# Patient Record
Sex: Male | Born: 1955 | Race: Black or African American | Hispanic: No | Marital: Married | Smoking: Never smoker
Health system: Southern US, Community
[De-identification: ages and names within clinical notes are randomized; demographics above are authoritative.]

## PROBLEM LIST (undated history)

## (undated) DIAGNOSIS — C801 Malignant (primary) neoplasm, unspecified: Secondary | ICD-10-CM

## (undated) DIAGNOSIS — I10 Essential (primary) hypertension: Secondary | ICD-10-CM

## (undated) HISTORY — PX: BACK SURGERY: SHX140

## (undated) HISTORY — PX: SHOULDER SURGERY: SHX246

## (undated) HISTORY — PX: KNEE SURGERY: SHX244

---

## 2010-08-20 ENCOUNTER — Other Ambulatory Visit (HOSPITAL_COMMUNITY): Payer: Self-pay | Admitting: Neurological Surgery

## 2010-08-20 ENCOUNTER — Ambulatory Visit (HOSPITAL_COMMUNITY)
Admission: RE | Admit: 2010-08-20 | Discharge: 2010-08-20 | Disposition: A | Discharge status: Home / Self Care | Payer: Worker's Compensation | Source: Ambulatory Visit | Attending: Neurological Surgery | Admitting: Neurological Surgery

## 2010-08-20 ENCOUNTER — Encounter (HOSPITAL_COMMUNITY)
Admission: RE | Admit: 2010-08-20 | Discharge: 2010-08-20 | Disposition: A | Discharge status: Home / Self Care | Payer: Worker's Compensation | Source: Ambulatory Visit | Attending: Neurological Surgery | Admitting: Neurological Surgery

## 2010-08-20 DIAGNOSIS — M47814 Spondylosis without myelopathy or radiculopathy, thoracic region: Secondary | ICD-10-CM | POA: Insufficient documentation

## 2010-08-20 DIAGNOSIS — Z01812 Encounter for preprocedural laboratory examination: Secondary | ICD-10-CM | POA: Insufficient documentation

## 2010-08-20 DIAGNOSIS — Z01810 Encounter for preprocedural cardiovascular examination: Secondary | ICD-10-CM | POA: Insufficient documentation

## 2010-08-20 DIAGNOSIS — Z01818 Encounter for other preprocedural examination: Secondary | ICD-10-CM | POA: Insufficient documentation

## 2010-08-20 DIAGNOSIS — M4802 Spinal stenosis, cervical region: Secondary | ICD-10-CM | POA: Insufficient documentation

## 2010-08-20 LAB — DIFFERENTIAL
Basophils Relative: 1 % (ref 0–1)
Lymphocytes Relative: 35 % (ref 12–46)
Lymphs Abs: 1.5 10*3/uL (ref 0.7–4.0)
Monocytes Absolute: 0.5 10*3/uL (ref 0.1–1.0)
Monocytes Relative: 11 % (ref 3–12)
Neutro Abs: 2.1 10*3/uL (ref 1.7–7.7)
Neutrophils Relative %: 47 % (ref 43–77)

## 2010-08-20 LAB — CBC
HCT: 42.9 % (ref 39.0–52.0)
Hemoglobin: 14.2 g/dL (ref 13.0–17.0)
MCH: 26.9 pg (ref 26.0–34.0)
MCHC: 33.1 g/dL (ref 30.0–36.0)
RBC: 5.27 MIL/uL (ref 4.22–5.81)

## 2010-08-20 LAB — BASIC METABOLIC PANEL
BUN: 12 mg/dL (ref 6–23)
CO2: 29 mEq/L (ref 19–32)
Calcium: 10 mg/dL (ref 8.4–10.5)
GFR calc non Af Amer: >60 mL/min (ref >60)
Glucose, Bld: 97 mg/dL (ref 70–99)
Sodium: 139 mEq/L (ref 135–145)

## 2010-08-20 LAB — SURGICAL PCR SCREEN: Staphylococcus aureus: NEGATIVE

## 2010-08-26 ENCOUNTER — Ambulatory Visit (HOSPITAL_COMMUNITY): Payer: Worker's Compensation

## 2010-08-26 ENCOUNTER — Observation Stay (HOSPITAL_COMMUNITY)
Admission: RE | Admit: 2010-08-26 | Discharge: 2010-08-27 | Disposition: A | Discharge status: Home / Self Care | Payer: Worker's Compensation | Source: Ambulatory Visit | Attending: Neurological Surgery | Admitting: Neurological Surgery

## 2010-08-26 DIAGNOSIS — M4712 Other spondylosis with myelopathy, cervical region: Principal | ICD-10-CM | POA: Insufficient documentation

## 2010-08-26 DIAGNOSIS — Z01818 Encounter for other preprocedural examination: Secondary | ICD-10-CM | POA: Insufficient documentation

## 2010-08-26 DIAGNOSIS — C61 Malignant neoplasm of prostate: Secondary | ICD-10-CM | POA: Insufficient documentation

## 2010-08-26 DIAGNOSIS — M5020 Other cervical disc displacement, unspecified cervical region: Secondary | ICD-10-CM | POA: Insufficient documentation

## 2010-08-26 DIAGNOSIS — J45909 Unspecified asthma, uncomplicated: Secondary | ICD-10-CM | POA: Insufficient documentation

## 2010-08-26 DIAGNOSIS — Z01812 Encounter for preprocedural laboratory examination: Secondary | ICD-10-CM | POA: Insufficient documentation

## 2010-08-26 DIAGNOSIS — Z01810 Encounter for preprocedural cardiovascular examination: Secondary | ICD-10-CM | POA: Insufficient documentation

## 2010-09-02 NOTE — Op Note (Signed)
NAME:  Andrew Miles, Andrew Miles NO.:  [id number]  MEDICAL RECORD NO.:  [id number]  LOCATION:  3526                         FACILITY:  [facility]  PHYSICIAN:  Stefani Dama, M.D.  DATE OF BIRTH:  08-19-1955  DATE OF PROCEDURE:  08/26/2010 DATE OF DISCHARGE:  08/27/2010                              OPERATIVE REPORT   PREOPERATIVE DIAGNOSES:  Spondylosis plus herniated nucleus pulposus, C4- C5 and C5-C6 with cervical radiculopathy, cervical myelopathy.  POSTOPERATIVE DIAGNOSES:  Spondylosis plus herniated nucleus pulposus, C4-C5 and C5-C6 with cervical radiculopathy, cervical myelopathy.  PROCEDURE:  Anterior cervical decompression, C4-C5 and C5-C6; arthrodesis with structural allograft, Alphatec plate fixation, C4-C6.  SURGEON:  Stefani Dama, MD  FIRST ASSISTANT:  Clydene Fake, MD  ANESTHESIA:  General endotracheal.  INDICATIONS:  Andrew Miles is a 55 year old individual who has had significant neck, shoulder, and arm pain and has evidence of spondylosis in the cervical spine.  He has on top of this herniated nucleus pulposus which causes cord compression eccentric to the right side.  He has had significant neck pain and spasm with a Lhermitte's phenomenon in addition to weakness in the biceps on the right side.  The patient was advised regarding need for surgical decompression arthrodesis and this is undertaken currently to decompress both the spinal cord and the C6 nerve root on the right side and C5 nerve root bilaterally secondary to spondylitic disease and bilateral foraminal stenosis.  PROCEDURE IN DETAIL:  The patient was brought to the operating room and placed on the table in supine position.  After the smooth induction of general endotracheal anesthesia, the neck was prepped with alcohol and DuraPrep and draped in a sterile fashion.  Five pounds of halter traction was also employed.  A transverse incision was created in the neck after identifying  the proper localization and dissection was then carried down through the platysma.  The plane between the sternocleidomastoid and the strap muscle was dissected bluntly until the first prevertebral space could be reached.  This was identified positively on a radiograph as that of C4-C5.  Then by stripping the longus colli muscle off either side of midline, a self-retaining retractor could be placed into the wound.  Ventral osteophytes were removed from C4-C5 and disk space was entered with a #15 blade and a combination of Kerrison and rongeurs was used to evacuate a significant quantity of severely degenerated desiccated disk material from within the space.  As the dissection continued, the interspace was opened and the region of posterior longitudinal ligament was carefully identified. There was noted be significant osteophytosis from the inferior margin at the body of C4 and the uncinate processes were severely hypertrophied. These were drilled down with a 2-mm dissecting tool and high-speed electric drill.  The old spur and the ligament were then lifted with a 1- mm and a 2-mm Kerrison punch and this opened up the epidural space. Dissection was carried out from the center region out to the lateral recesses on the right side and on the left side.  Once this was completed, the C5 nerve roots were well decompressed out into the lateral recesses.  Hemostasis in the epidural space was achieved meticulously with  bipolar cautery and small pledgets of Gelfoam soaked in thrombin which were later irrigated away.  A 7-mm large AlphaGraft was used.  This was contoured with a high-speed bur and barrel bit.  The endplates were also shaped to the same barrel bit and then the graft was filled with demineralized bone matrix and placed into the interspace. Attention was then turned to C5-C6 where similar process was carried out and here was even larger bone spurs, particularly on the right side  at C5-C6.  These were removed and allowed for good decompression of the C6 nerve root and the common dural tube.  Once the space was decompressed well and hemostasis was again reestablished, the endplates were shaved smooth.  A 7-mm transgraft of this appropriate size and shape was again fitted and filled with demineralized bone matrix placed in the interspace and at this time traction was removed.  The procedure was done by myself and Dr. Phoebe Perch with Dr. Phoebe Perch helping with retraction and working on the contralateral side as to provide an adequate decompression as we progressed.  At this point, a 34-mm standard size Alphatec plate of the Trestle variety was fitted to the ventral aspect of the vertebral bodies and secured with 6 locking variable angle 4 x 14 mm screws.  Final radiograph was obtained which showed the superior portion of the construct nicely.  The inferior portion was buried into the shoulders, this despite using some traction to obtain good radiograph.  With the visual inspection then verifying good position of the hardware, the wound was irrigated copiously with antibiotic irrigating solution.  Hemostasis in the soft tissues was meticulously obtained.  Remnants of some demineralized bone matrix were placed into the lateral gutters at C4-C5 and C5-C6 and the retractors were removed and the platysma was closed with 3-0 Vicryl in interrupted fashion, 3-0 Vicryl using subcuticular tissues.  Dermabond was used on the skin. Blood loss for the procedure was estimated at about 150 mL.  The patient tolerated the procedure well and was returned to the recovery room in stable condition.     Stefani Dama, M.D.     Merla Riches  D:  08/28/2010  T:  08/28/2010  Job:  [id number]  Electronically Signed by Barnett Abu M.D. on 09/02/2010 07:31:51 AM

## 2013-05-16 ENCOUNTER — Other Ambulatory Visit: Payer: Self-pay | Admitting: Orthopedic Surgery

## 2013-05-16 DIAGNOSIS — M545 Low back pain, unspecified: Secondary | ICD-10-CM

## 2013-05-20 ENCOUNTER — Ambulatory Visit
Admission: RE | Admit: 2013-05-20 | Discharge: 2013-05-20 | Disposition: A | Discharge status: Home / Self Care | Payer: No Typology Code available for payment source | Source: Ambulatory Visit | Attending: Orthopedic Surgery | Admitting: Orthopedic Surgery

## 2013-05-20 DIAGNOSIS — M545 Low back pain, unspecified: Secondary | ICD-10-CM

## 2015-11-08 ENCOUNTER — Emergency Department (HOSPITAL_BASED_OUTPATIENT_CLINIC_OR_DEPARTMENT_OTHER): Payer: No Typology Code available for payment source

## 2015-11-08 ENCOUNTER — Encounter (HOSPITAL_BASED_OUTPATIENT_CLINIC_OR_DEPARTMENT_OTHER): Payer: Self-pay | Admitting: Emergency Medicine

## 2015-11-08 ENCOUNTER — Emergency Department (HOSPITAL_BASED_OUTPATIENT_CLINIC_OR_DEPARTMENT_OTHER)
Admission: EM | Admit: 2015-11-08 | Discharge: 2015-11-08 | Disposition: A | Discharge status: Home / Self Care | Payer: No Typology Code available for payment source | Attending: Emergency Medicine | Admitting: Emergency Medicine

## 2015-11-08 DIAGNOSIS — I10 Essential (primary) hypertension: Secondary | ICD-10-CM | POA: Insufficient documentation

## 2015-11-08 DIAGNOSIS — Y999 Unspecified external cause status: Secondary | ICD-10-CM | POA: Insufficient documentation

## 2015-11-08 DIAGNOSIS — Z79899 Other long term (current) drug therapy: Secondary | ICD-10-CM | POA: Insufficient documentation

## 2015-11-08 DIAGNOSIS — Y92410 Unspecified street and highway as the place of occurrence of the external cause: Secondary | ICD-10-CM | POA: Insufficient documentation

## 2015-11-08 DIAGNOSIS — S4992XA Unspecified injury of left shoulder and upper arm, initial encounter: Secondary | ICD-10-CM | POA: Insufficient documentation

## 2015-11-08 DIAGNOSIS — Y9389 Activity, other specified: Secondary | ICD-10-CM | POA: Diagnosis not present

## 2015-11-08 HISTORY — DX: Essential (primary) hypertension: I10

## 2015-11-08 MED ORDER — HYDROCODONE-ACETAMINOPHEN 5-325 MG PO TABS: 1.0000 | ORAL_TABLET | Freq: Four times a day (QID) | ORAL | 0 refills | Status: DC | PRN

## 2015-11-08 MED ORDER — HYDROCODONE-ACETAMINOPHEN 5-325 MG PO TABS
1.0000 | ORAL_TABLET | Freq: Once | ORAL | Status: AC
  Administered 2015-11-08: 1 via ORAL
  Filled 2015-11-08: qty 1

## 2015-11-08 MED ORDER — NAPROXEN 250 MG PO TABS
500.0000 mg | ORAL_TABLET | Freq: Once | ORAL | Status: AC
  Administered 2015-11-08: 500 mg via ORAL
  Filled 2015-11-08: qty 2
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
## 2020-06-26 ENCOUNTER — Other Ambulatory Visit: Payer: Self-pay

## 2020-06-26 ENCOUNTER — Emergency Department (HOSPITAL_BASED_OUTPATIENT_CLINIC_OR_DEPARTMENT_OTHER): Payer: Medicare Other

## 2020-06-26 ENCOUNTER — Encounter (HOSPITAL_BASED_OUTPATIENT_CLINIC_OR_DEPARTMENT_OTHER): Payer: Self-pay

## 2020-06-26 ENCOUNTER — Emergency Department (HOSPITAL_BASED_OUTPATIENT_CLINIC_OR_DEPARTMENT_OTHER)
Admission: EM | Admit: 2020-06-26 | Discharge: 2020-06-26 | Disposition: A | Discharge status: Home / Self Care | Payer: Medicare Other | Attending: Emergency Medicine | Admitting: Emergency Medicine

## 2020-06-26 DIAGNOSIS — K649 Unspecified hemorrhoids: Secondary | ICD-10-CM | POA: Diagnosis not present

## 2020-06-26 DIAGNOSIS — Z79899 Other long term (current) drug therapy: Secondary | ICD-10-CM | POA: Insufficient documentation

## 2020-06-26 DIAGNOSIS — I10 Essential (primary) hypertension: Secondary | ICD-10-CM | POA: Insufficient documentation

## 2020-06-26 DIAGNOSIS — Z8546 Personal history of malignant neoplasm of prostate: Secondary | ICD-10-CM | POA: Insufficient documentation

## 2020-06-26 DIAGNOSIS — K6289 Other specified diseases of anus and rectum: Secondary | ICD-10-CM

## 2020-06-26 HISTORY — DX: Malignant (primary) neoplasm, unspecified: C80.1

## 2020-06-26 LAB — OCCULT BLOOD X 1 CARD TO LAB, STOOL: Fecal Occult Bld: POSITIVE — AB

## 2020-06-26 LAB — COMPREHENSIVE METABOLIC PANEL
ALT: 14 U/L (ref 0–44)
AST: 21 U/L (ref 15–41)
Albumin: 3.5 g/dL (ref 3.5–5.0)
Alkaline Phosphatase: 59 U/L (ref 38–126)
Anion gap: 8 (ref 5–15)
BUN: 12 mg/dL (ref 8–23)
CO2: 26 mmol/L (ref 22–32)
Calcium: 9 mg/dL (ref 8.9–10.3)
Chloride: 103 mmol/L (ref 98–111)
Creatinine, Ser: 1.23 mg/dL (ref 0.61–1.24)
GFR, Estimated: >60 mL/min (ref >60)
Glucose, Bld: 125 mg/dL — ABNORMAL HIGH (ref 70–99)
Potassium: 3.5 mmol/L (ref 3.5–5.1)
Sodium: 137 mmol/L (ref 135–145)
Total Bilirubin: 0.2 mg/dL — ABNORMAL LOW (ref 0.3–1.2)
Total Protein: 7.6 g/dL (ref 6.5–8.1)

## 2020-06-26 LAB — CBC WITH DIFFERENTIAL/PLATELET
Abs Immature Granulocytes: 0.02 10*3/uL (ref 0.00–0.07)
Basophils Absolute: 0 10*3/uL (ref 0.0–0.1)
Basophils Relative: 1 %
Eosinophils Absolute: 0.1 10*3/uL (ref 0.0–0.5)
Eosinophils Relative: 2 %
HCT: 44.1 % (ref 39.0–52.0)
Hemoglobin: 13.8 g/dL (ref 13.0–17.0)
Immature Granulocytes: 0 %
Lymphocytes Relative: 12 %
Lymphs Abs: 1 10*3/uL (ref 0.7–4.0)
MCH: 27.2 pg (ref 26.0–34.0)
MCHC: 31.3 g/dL (ref 30.0–36.0)
MCV: 86.8 fL (ref 80.0–100.0)
Monocytes Absolute: 0.5 10*3/uL (ref 0.1–1.0)
Monocytes Relative: 6 %
Neutro Abs: 6.6 10*3/uL (ref 1.7–7.7)
Neutrophils Relative %: 79 %
Platelets: 254 10*3/uL (ref 150–400)
RBC: 5.08 MIL/uL (ref 4.22–5.81)
RDW: 14.4 % (ref 11.5–15.5)
WBC: 8.3 10*3/uL (ref 4.0–10.5)
nRBC: 0 % (ref 0.0–0.2)

## 2020-06-26 MED ORDER — TAMSULOSIN HCL 0.4 MG PO CAPS: 0.4000 mg | ORAL_CAPSULE | Freq: Every day | ORAL | 0 refills | Status: DC

## 2020-06-26 MED ORDER — FLEET ENEMA 7-19 GM/118ML RE ENEM: 1.0000 | ENEMA | Freq: Once | RECTAL | Status: DC

## 2020-06-26 MED ORDER — POLYETHYLENE GLYCOL 3350 17 G PO PACK: 17.0000 g | PACK | Freq: Every day | ORAL | 0 refills | Status: DC

## 2020-06-26 MED ORDER — HYDROCORTISONE (PERIANAL) 2.5 % EX CREA: 1.0000 "application " | TOPICAL_CREAM | Freq: Two times a day (BID) | CUTANEOUS | 0 refills | Status: DC

## 2020-06-26 NOTE — ED Notes (Signed)
Into Pt. Room .Marland KitchenPlaced Pt. Into gown and introduced self to Pt.  Pt. Reports he has been having blood after having BM today.  Pt. Is shaking and feels weak he reports to [employer].

## 2020-06-26 NOTE — ED Notes (Signed)
Pt. Had large BM earlier in bed.  Pt. Feeling better.

## 2020-06-26 NOTE — ED Triage Notes (Signed)
Pt arrives with c/o rectal pain starting around 10 am today states he tried to have a BM but was unable to do so, had dark blood when trying to have BM. Constipation since Friday or Saturday. Denies abdominal pain, NV pt diaphoretic in triage.

## 2020-06-26 NOTE — ED Provider Notes (Signed)
[place] EMERGENCY DEPARTMENT Provider Note   CSN: [id number] Arrival date & time: 06/26/20  1208     History Chief Complaint  Patient presents with  . Rectal Pain    Andrew Miles is a 65 y.o. male presents with a chief complaint of rectal pain and rectal bleeding.  Patient reports that he started having rectal bleeding this morning at approximately 11:30 when he was attempting to have a bowel movement.  Patient reported that bleeding was only noticed after wiping with toliwt paper.  Patient reports pain and discomfort at his rectum since this event.  Pain has been constant.  Pain is worse when attempting to have a bowel movement.  Patient's last bowel movement was on 5/27.  No blood in stool or melena on noted at that time.  Is not on any blood thinners.  Reports history of prior hernia repair.  Patient denies any abdominal pain, nausea, vomiting, diarrhea, dysuria, hematuria, fever chills, new back pain, unexpected weight loss, night sweats, penile discharge, swelling or tenderness to genitals, genital sores or lesions.   HPI     Past Medical History:  Diagnosis Date  . Cancer [facility])    prostate cancer 07/08  . Hypertension     There are no problems to display for this patient.   Past Surgical History:  Procedure Laterality Date  . BACK SURGERY    . KNEE SURGERY    . SHOULDER SURGERY         No family history on file.  Social History   Tobacco Use  . Smoking status: Never Smoker  . Smokeless tobacco: Never Used  Substance Use Topics  . Alcohol use: Yes    Comment: social  . Drug use: Never    Home Medications Prior to Admission medications   Medication Sig Start Date End Date Taking? Authorizing Provider  tamsulosin (FLOMAX) 0.4 MG CAPS capsule Take 1 capsule by mouth daily. 08/02/15  Yes [provider]  HYDROcodone-acetaminophen (NORCO) 5-325 MG tablet Take 1-2 tablets by mouth every 6 (six) hours as needed (for pain). 11/08/15    Molpus, John, MD  lisinopril (PRINIVIL,ZESTRIL) 10 MG tablet Take 10 mg by mouth daily.    [provider]  tadalafil (CIALIS) 20 MG tablet Take 20 mg by mouth daily as needed. 05/24/20   [provider]  tamsulosin (FLOMAX) 0.4 MG CAPS capsule Take 0.4 mg by mouth.    [provider]    Allergies    Patient has no known allergies.  Review of Systems   Review of Systems  Constitutional: Negative for fatigue.  Respiratory: Negative for shortness of breath.   Cardiovascular: Negative for chest pain.  Gastrointestinal: Positive for anal bleeding, constipation, diarrhea and rectal pain. Negative for abdominal distention, abdominal pain, nausea and vomiting.  Genitourinary: Negative for dysuria, genital sores, hematuria, penile discharge, penile pain, penile swelling, scrotal swelling and testicular pain.  Musculoskeletal: Positive for back pain (lumbar back pain at baseline, no change).  Skin: Negative for color change and pallor.  Neurological: Negative for syncope and light-headedness.  Hematological: Does not bruise/bleed easily.  Psychiatric/Behavioral: Negative for confusion.    Physical Exam Updated Vital Signs BP (!) 120/49 (BP Location: Left Arm)   Pulse 84   Temp 97.7 F (36.5 C) (Oral)   Resp 18   Ht 6' (1.829 m)   Wt 129.3 kg   SpO2 94%   BMI 38.65 kg/m   Physical Exam Vitals and nursing note reviewed. Exam  conducted with a chaperone present (Male nurse tech present as chaperone).  Constitutional:      General: He is not in acute distress.    Appearance: He is not ill-appearing, toxic-appearing or diaphoretic.  HENT:     Head: Normocephalic.  Eyes:     General: No scleral icterus.       Right eye: No discharge.        Left eye: No discharge.  Cardiovascular:     Rate and Rhythm: Normal rate.  Pulmonary:     Effort: Pulmonary effort is normal.  Abdominal:     General: Abdomen is protuberant. Bowel sounds are normal. There is no  distension. There are no signs of injury.     Palpations: There is no mass or pulsatile mass.     Tenderness: There is no abdominal tenderness. There is no guarding or rebound.     Hernia: There is no hernia in the umbilical area or ventral area.  Genitourinary:    Rectum: Guaiac result positive. Tenderness and external hemorrhoid present. No mass, anal fissure or internal hemorrhoid. Normal anal tone.     Comments: External hemorrhoid noted at 6 o'clock position.  Light brown stool in rectal vault, no blood or melena observed.  Hard stool was noted in patient's rectal vault.  No erythema, induration, or exquisite tenderness around patient's rectum Skin:    General: Skin is warm and dry.  Neurological:     General: No focal deficit present.     Mental Status: He is alert.  Psychiatric:        Behavior: Behavior is cooperative.     ED Results / Procedures / Treatments   Labs (all labs ordered are listed, but only abnormal results are displayed) Labs Reviewed  COMPREHENSIVE METABOLIC PANEL - Abnormal; Notable for the following components:      Result Value   Glucose, Bld 125 (*)    Total Bilirubin 0.2 (*)    All other components within normal limits  OCCULT BLOOD X 1 CARD TO LAB, STOOL - Abnormal; Notable for the following components:   Fecal Occult Bld POSITIVE (*)    All other components within normal limits  CBC WITH DIFFERENTIAL/PLATELET  POC OCCULT BLOOD, ED    EKG None  Radiology CT Abdomen Pelvis Wo Contrast  Result Date: 06/26/2020 CLINICAL DATA:  65 year old male with perirectal abscess. EXAM: CT ABDOMEN AND PELVIS WITHOUT CONTRAST TECHNIQUE: Multidetector CT imaging of the abdomen and pelvis was performed following the standard protocol without IV contrast. COMPARISON:  None. FINDINGS: Evaluation of this exam is limited in the absence of intravenous contrast. Lower chest: The visualized lung bases are clear. No intra-abdominal free air or free fluid. Hepatobiliary:  Several small hepatic hypodense lesions are not characterized on this noncontrast CT. No intrahepatic biliary dilatation. There are multiple stones within the gallbladder as well as within the gallbladder neck. No pericholecystic fluid or evidence of acute cholecystitis by CT. Ultrasound may provide better evaluation if there is high clinical concern for acute cholecystitis. Pancreas: Unremarkable. No pancreatic ductal dilatation or surrounding inflammatory changes. Spleen: Normal in size without focal abnormality. Adrenals/Urinary Tract: The adrenal glands are unremarkable. The kidneys, visualized ureters, and urinary bladder appear unremarkable. There is extension of a small portion of the bladder into the left inguinal hernia. Stomach/Bowel: There is no bowel obstruction or active inflammation. The appendix is normal. There is mild perianal edema and stranding. No fluid collection or abscess. Vascular/Lymphatic: Mild aortoiliac atherosclerotic disease. The IVC is  unremarkable. No portal venous gas. There is no adenopathy. Reproductive: Prostate fiducial markers noted. Other: Small fat containing left inguinal hernia as well as a small fat containing umbilical hernia. Musculoskeletal: Degenerative changes of the spine and hips. No acute osseous pathology. IMPRESSION: 1. Mild perianal edema and stranding. No fluid collection or abscess. 2. No bowel obstruction. Normal appendix. 3. Cholelithiasis. 4. Aortic Atherosclerosis (ICD10-I70.0). Electronically Signed   By: Anner Crete M.D.   On: 06/26/2020 16:11   DG Abdomen 1 View  Result Date: 06/26/2020 CLINICAL DATA:  Rectal pain EXAM: ABDOMEN - 1 VIEW COMPARISON:  None. FINDINGS: The bowel gas pattern is normal. 3 metallic densities project in the region of the prostate. Bilateral hip DJD. No radio-opaque calculi or other significant radiographic abnormality are seen. IMPRESSION: Normal bowel gas pattern. Electronically Signed   By: Lucrezia Europe M.D.   On:  06/26/2020 15:03    Procedures Procedures   Medications Ordered in ED Medications - No data to display  ED Course  I have reviewed the triage vital signs and the nursing notes.  Pertinent labs & imaging results that were available during my care of the patient were reviewed by me and considered in my medical decision making (see chart for details).    MDM Rules/Calculators/A&P                          Alert 65 year old male no acute distress, nontoxic-appearing.  Patient presents with chief plaint of rectal pain and bleeding.  Patient symptoms started this morning.  Patient also endorses constipation with last normal bowel movement on 5/27.  Physical exam abdomen soft, nondistended, nontender.  Patient noted to have external hemorrhoid at 6 o'clock position.  Light brown stool in rectal vault without melena or frank red blood.  Hemoccult positive.  Patient has no surrounding erythema, induration or exquisite tenderness around rectum.  Dark stool noted in patient's rectal vault.  Will obtain CBC, CMP, KUB.  CBC shows no signs of leukocytosis or anemia. CMP is unremarkable. KUB shows normal bowel gas pattern, 3 metallic densities project in the region of the prostate, bilateral hip DJD, no radiopaque calculi or other significant radiographic abnormality are seen.  Patient was able to have 2 bowel movements while in emergency department.  Patient reports that first bowel movement was quite large in nature.  Patient denied any blood or melena in his bowel movements.  Patient continues to complain of rectal pain.  We will obtain noncontrast CT scan to evaluate for possible perirectal abscess.  Noncontrast CT was obtained due to national contrast dye shortage.  CT abdomen pelvis shows mild perianal edema and stranding, no fluid collection or abscess.  No bowel obstruction.  Normal appendix.  Cholelithiasis without signs of cholecystitis.  Without signs of perianal abscess or fluid  collection Will discharge patient with prescription for Anusol and MiraLAX.  Patient advised to perform sitz bath's.  Patient advised to follow-up with primary care provider.  Discussed results, findings, treatment and follow up. Patient advised of return precautions. Patient verbalized understanding and agreed with plan.   Final Clinical Impression(s) / ED Diagnoses Final diagnoses:  Rectal pain  Hemorrhoids, unspecified hemorrhoid type    Rx / DC Orders ED Discharge Orders         Ordered    tamsulosin (FLOMAX) 0.4 MG CAPS capsule  Daily,   Status:  Discontinued        06/26/20 1255    polyethylene glycol (  MIRALAX) 17 g packet  Daily        06/26/20 1639    hydrocortisone (ANUSOL-HC) 2.5 % rectal cream  2 times daily        06/26/20 1639           Dyann Ruddle 06/26/20 2213    Quintella Reichert, MD 06/29/20 1007

## 2020-06-26 NOTE — Discharge Instructions (Addendum)
You came to the emergency department today to be evaluated for your rectal bleeding and pain.  He had a hemorrhoid on physical exam.  Your CT scan showed no perirectal abscess.  Your lab results were reassuring.  Please take MiraLAX once daily for the next 7 to 14 days.  Please use Anusol gel twice daily as prescribed.  If your symptoms do not improve please follow-up with your primary care provider.  Get help right away if you have: Uncontrolled bleeding from your rectum.

## 2021-02-13 ENCOUNTER — Emergency Department (HOSPITAL_BASED_OUTPATIENT_CLINIC_OR_DEPARTMENT_OTHER)
Admission: EM | Admit: 2021-02-13 | Discharge: 2021-02-13 | Disposition: A | Discharge status: Home / Self Care | Payer: Medicare Other | Attending: Emergency Medicine | Admitting: Emergency Medicine

## 2021-02-13 ENCOUNTER — Emergency Department (HOSPITAL_BASED_OUTPATIENT_CLINIC_OR_DEPARTMENT_OTHER): Payer: Medicare Other

## 2021-02-13 ENCOUNTER — Other Ambulatory Visit: Payer: Self-pay

## 2021-02-13 ENCOUNTER — Encounter (HOSPITAL_BASED_OUTPATIENT_CLINIC_OR_DEPARTMENT_OTHER): Payer: Self-pay | Admitting: Urology

## 2021-02-13 DIAGNOSIS — W11XXXA Fall on and from ladder, initial encounter: Secondary | ICD-10-CM | POA: Insufficient documentation

## 2021-02-13 DIAGNOSIS — M545 Low back pain, unspecified: Secondary | ICD-10-CM | POA: Diagnosis not present

## 2021-02-13 DIAGNOSIS — M542 Cervicalgia: Secondary | ICD-10-CM | POA: Diagnosis not present

## 2021-02-13 DIAGNOSIS — S7001XA Contusion of right hip, initial encounter: Secondary | ICD-10-CM | POA: Insufficient documentation

## 2021-02-13 DIAGNOSIS — M25521 Pain in right elbow: Secondary | ICD-10-CM | POA: Insufficient documentation

## 2021-02-13 DIAGNOSIS — M25551 Pain in right hip: Secondary | ICD-10-CM | POA: Insufficient documentation

## 2021-02-13 DIAGNOSIS — T148XXA Other injury of unspecified body region, initial encounter: Secondary | ICD-10-CM

## 2021-02-13 DIAGNOSIS — R072 Precordial pain: Secondary | ICD-10-CM | POA: Insufficient documentation

## 2021-02-13 DIAGNOSIS — I10 Essential (primary) hypertension: Secondary | ICD-10-CM | POA: Insufficient documentation

## 2021-02-13 DIAGNOSIS — R519 Headache, unspecified: Secondary | ICD-10-CM | POA: Diagnosis present

## 2021-02-13 DIAGNOSIS — S0003XA Contusion of scalp, initial encounter: Secondary | ICD-10-CM | POA: Insufficient documentation

## 2021-02-13 DIAGNOSIS — S0000XA Unspecified superficial injury of scalp, initial encounter: Secondary | ICD-10-CM | POA: Diagnosis present

## 2021-02-13 DIAGNOSIS — Z79899 Other long term (current) drug therapy: Secondary | ICD-10-CM | POA: Insufficient documentation

## 2021-02-13 LAB — CBC WITH DIFFERENTIAL/PLATELET
Abs Immature Granulocytes: 0.04 10*3/uL (ref 0.00–0.07)
Basophils Absolute: 0 10*3/uL (ref 0.0–0.1)
Basophils Relative: 0 %
Eosinophils Absolute: 0.1 10*3/uL (ref 0.0–0.5)
Eosinophils Relative: 1 %
HCT: 41.9 % (ref 39.0–52.0)
Hemoglobin: 13.5 g/dL (ref 13.0–17.0)
Immature Granulocytes: 0 %
Lymphocytes Relative: 8 %
Lymphs Abs: 1.2 10*3/uL (ref 0.7–4.0)
MCH: 26.8 pg (ref 26.0–34.0)
MCHC: 32.2 g/dL (ref 30.0–36.0)
MCV: 83.3 fL (ref 80.0–100.0)
Monocytes Absolute: 0.8 10*3/uL (ref 0.1–1.0)
Monocytes Relative: 6 %
Neutro Abs: 12.1 10*3/uL — ABNORMAL HIGH (ref 1.7–7.7)
Neutrophils Relative %: 85 %
Platelets: 276 10*3/uL (ref 150–400)
RBC: 5.03 MIL/uL (ref 4.22–5.81)
RDW: 14.4 % (ref 11.5–15.5)
WBC: 14.3 10*3/uL — ABNORMAL HIGH (ref 4.0–10.5)
nRBC: 0 % (ref 0.0–0.2)

## 2021-02-13 LAB — COMPREHENSIVE METABOLIC PANEL
ALT: 25 U/L (ref 0–44)
AST: 48 U/L — ABNORMAL HIGH (ref 15–41)
Albumin: 3.4 g/dL — ABNORMAL LOW (ref 3.5–5.0)
Alkaline Phosphatase: 58 U/L (ref 38–126)
Anion gap: 8 (ref 5–15)
BUN: 13 mg/dL (ref 8–23)
CO2: 21 mmol/L — ABNORMAL LOW (ref 22–32)
Calcium: 9.1 mg/dL (ref 8.9–10.3)
Chloride: 102 mmol/L (ref 98–111)
Creatinine, Ser: 1.1 mg/dL (ref 0.61–1.24)
GFR, Estimated: >60 mL/min (ref >60)
Glucose, Bld: 109 mg/dL — ABNORMAL HIGH (ref 70–99)
Potassium: 4 mmol/L (ref 3.5–5.1)
Sodium: 131 mmol/L — ABNORMAL LOW (ref 135–145)
Total Bilirubin: 0.5 mg/dL (ref 0.3–1.2)
Total Protein: 7.5 g/dL (ref 6.5–8.1)

## 2021-02-13 LAB — TROPONIN I (HIGH SENSITIVITY): Troponin I (High Sensitivity): 3 ng/L (ref <18)

## 2021-02-13 MED ORDER — MORPHINE SULFATE (PF) 4 MG/ML IV SOLN
4.0000 mg | Freq: Once | INTRAVENOUS | Status: AC
  Administered 2021-02-13: 4 mg via INTRAVENOUS
  Filled 2021-02-13: qty 1

## 2021-02-13 MED ORDER — HYDROCODONE-ACETAMINOPHEN 5-325 MG PO TABS: 1.0000 | ORAL_TABLET | Freq: Four times a day (QID) | ORAL | 0 refills | Status: DC | PRN

## 2021-02-13 MED ORDER — IOHEXOL 300 MG/ML  SOLN
125.0000 mL | Freq: Once | INTRAMUSCULAR | Status: AC | PRN
  Administered 2021-02-13: 125 mL via INTRAVENOUS

## 2021-02-13 MED ORDER — ONDANSETRON HCL 4 MG/2ML IJ SOLN
4.0000 mg | Freq: Once | INTRAMUSCULAR | Status: AC
  Administered 2021-02-13: 4 mg via INTRAVENOUS
  Filled 2021-02-13: qty 2

## 2021-02-13 NOTE — ED Provider Notes (Signed)
[facility] Provider Note   CSN: [id number] Arrival date & time: 02/13/21  1637     History  Chief Complaint  Patient presents with   fall from ladder    CECILE GUEVARA is a 66 y.o. male hx of HTN, here presenting with fall.  Patient states that he was coming down from a 10 foot ladder and was about 7 when he fell and hit his right hip.  He also fell on the right elbow and hit his head.  Patient states that he has lower back pain and headache and chest pain and right hip pain. Went to urgent care and sent for evaluation.   The history is provided by the patient.      Home Medications Prior to Admission medications   Medication Sig Start Date End Date Taking? Authorizing Provider  HYDROcodone-acetaminophen (NORCO) 5-325 MG tablet Take 1-2 tablets by mouth every 6 (six) hours as needed (for pain). 11/08/15   Molpus, John, MD  hydrocortisone (ANUSOL-HC) 2.5 % rectal cream Place 1 application rectally 2 (two) times daily. 06/26/20   Loni Beckwith, PA-C  lisinopril (PRINIVIL,ZESTRIL) 10 MG tablet Take 10 mg by mouth daily.    [provider]  polyethylene glycol (MIRALAX) 17 g packet Take 17 g by mouth daily. 06/26/20   Loni Beckwith, PA-C  tadalafil (CIALIS) 20 MG tablet Take 20 mg by mouth daily as needed. 05/24/20   [provider]      Allergies    Patient has no known allergies.    Review of Systems   Review of Systems  Musculoskeletal:  Positive for back pain.       Right hip pain and headache  All other systems reviewed and are negative.  Physical Exam Updated Vital Signs BP 125/64    Pulse 81    Temp 97.7 F (36.5 C) (Oral)    Resp 17    Ht 6' (1.829 m)    Wt 129.3 kg    SpO2 95%    BMI 38.65 kg/m  Physical Exam Vitals and nursing note reviewed.  Constitutional:      Comments: Uncomfortable  HENT:     Head:     Comments: Tenderness in posterior scalp with no obvious hematoma or laceration    Nose:  Nose normal.     Mouth/Throat:     Mouth: Mucous membranes are moist.  Eyes:     Extraocular Movements: Extraocular movements intact.     Pupils: Pupils are equal, round, and reactive to light.  Neck:     Comments: Right paracervical tenderness Cardiovascular:     Rate and Rhythm: Normal rate and regular rhythm.     Pulses: Normal pulses.     Heart sounds: Normal heart sounds.  Pulmonary:     Effort: Pulmonary effort is normal.     Breath sounds: Normal breath sounds.     Comments: Tenderness in the right lower ribs Abdominal:     General: Abdomen is flat.     Palpations: Abdomen is soft.  Musculoskeletal:     Cervical back: Normal range of motion and neck supple.     Comments: Right paralumbar and midline tenderness.  Patient has ecchymosis around the right hip area.  However he was able to range the hip.  Patient is able to bear weight on the right hip as well.  No obvious hip deformity.  Patient does have tenderness in the right elbow with no obvious deformity  Skin:    General: Skin is warm.     Capillary Refill: Capillary refill takes less than 2 seconds.  Neurological:     General: No focal deficit present.     Mental Status: He is oriented to person, place, and time.  Psychiatric:        Mood and Affect: Mood normal.    ED Results / Procedures / Treatments   Labs (all labs ordered are listed, but only abnormal results are displayed) Labs Reviewed  CBC WITH DIFFERENTIAL/PLATELET - Abnormal; Notable for the following components:      Result Value   WBC 14.3 (*)    Neutro Abs 12.1 (*)    All other components within normal limits  COMPREHENSIVE METABOLIC PANEL - Abnormal; Notable for the following components:   Sodium 131 (*)    CO2 21 (*)    Glucose, Bld 109 (*)    Albumin 3.4 (*)    AST 48 (*)    All other components within normal limits  TROPONIN I (HIGH SENSITIVITY)  TROPONIN I (HIGH SENSITIVITY)    EKG EKG Interpretation  Date/Time:  Wednesday February 13 2021 17:34:43 EST Ventricular Rate:  88 PR Interval:  171 QRS Duration: 98 QT Interval:  357 QTC Calculation: 432 R Axis:   -19 Text Interpretation: Sinus rhythm Borderline left axis deviation RSR' in V1 or V2, right VCD or RVH No significant change since last tracing Confirmed by Wandra Arthurs ([id number]) on 02/13/2021 5:38:53 PM  Radiology DG Elbow Complete Right  Result Date: 02/13/2021 CLINICAL DATA:  Fall, fall, pain. EXAM: RIGHT ELBOW - COMPLETE 3+ VIEW COMPARISON:  None. FINDINGS: There is no evidence of fracture, dislocation, or joint effusion. No significant arthropathy. Olecranon spur is present. Soft tissues are unremarkable. IMPRESSION: Negative. Electronically Signed   By: Ronney Asters M.D.   On: 02/13/2021 19:24   CT Head Wo Contrast  Result Date: 02/13/2021 CLINICAL DATA:  Fall from ladder with headaches and neck pain, initial encounter EXAM: CT HEAD WITHOUT CONTRAST CT CERVICAL SPINE WITHOUT CONTRAST TECHNIQUE: Multidetector CT imaging of the head and cervical spine was performed following the standard protocol without intravenous contrast. Multiplanar CT image reconstructions of the cervical spine were also generated. RADIATION DOSE REDUCTION: This exam was performed according to the departmental dose-optimization program which includes automated exposure control, adjustment of the mA and/or kV according to patient size and/or use of iterative reconstruction technique. COMPARISON:  None. FINDINGS: CT HEAD FINDINGS Brain: No evidence of acute infarction, hemorrhage, hydrocephalus, extra-axial collection or mass lesion/mass effect. Vascular: No hyperdense vessel or unexpected calcification. Skull: Normal. Negative for fracture or focal lesion. Sinuses/Orbits: No acute finding. Other: None. CT CERVICAL SPINE FINDINGS Alignment: Within normal limits. Skull base and vertebrae: 7 cervical segments are well visualized. Prior fusion at C4-5 and C5-6 is noted with anterior fixation. Multilevel  facet hypertrophic changes are noted. No anterolisthesis is seen. No acute fracture or acute facet abnormality is noted. Soft tissues and spinal canal: Surrounding soft tissue structures are within normal limits Upper chest: Visualized lung apices are unremarkable. Other: None IMPRESSION: CT of the head: No acute intracranial abnormality noted. CT of the cervical spine: Degenerative and postoperative changes without acute abnormality. Electronically Signed   By: Inez Catalina M.D.   On: 02/13/2021 19:08   CT Cervical Spine Wo Contrast  Result Date: 02/13/2021 CLINICAL DATA:  Fall from ladder with headaches and neck pain, initial encounter EXAM: CT HEAD WITHOUT CONTRAST CT CERVICAL SPINE WITHOUT CONTRAST  TECHNIQUE: Multidetector CT imaging of the head and cervical spine was performed following the standard protocol without intravenous contrast. Multiplanar CT image reconstructions of the cervical spine were also generated. RADIATION DOSE REDUCTION: This exam was performed according to the departmental dose-optimization program which includes automated exposure control, adjustment of the mA and/or kV according to patient size and/or use of iterative reconstruction technique. COMPARISON:  None. FINDINGS: CT HEAD FINDINGS Brain: No evidence of acute infarction, hemorrhage, hydrocephalus, extra-axial collection or mass lesion/mass effect. Vascular: No hyperdense vessel or unexpected calcification. Skull: Normal. Negative for fracture or focal lesion. Sinuses/Orbits: No acute finding. Other: None. CT CERVICAL SPINE FINDINGS Alignment: Within normal limits. Skull base and vertebrae: 7 cervical segments are well visualized. Prior fusion at C4-5 and C5-6 is noted with anterior fixation. Multilevel facet hypertrophic changes are noted. No anterolisthesis is seen. No acute fracture or acute facet abnormality is noted. Soft tissues and spinal canal: Surrounding soft tissue structures are within normal limits Upper chest:  Visualized lung apices are unremarkable. Other: None IMPRESSION: CT of the head: No acute intracranial abnormality noted. CT of the cervical spine: Degenerative and postoperative changes without acute abnormality. Electronically Signed   By: Inez Catalina M.D.   On: 02/13/2021 19:08   CT CHEST ABDOMEN PELVIS W CONTRAST  Result Date: 02/13/2021 CLINICAL DATA:  Chest trauma, blunt.  Status post fall EXAM: CT CHEST, ABDOMEN, AND PELVIS WITH CONTRAST TECHNIQUE: Multidetector CT imaging of the chest, abdomen and pelvis was performed following the standard protocol during bolus administration of intravenous contrast. RADIATION DOSE REDUCTION: This exam was performed according to the departmental dose-optimization program which includes automated exposure control, adjustment of the mA and/or kV according to patient size and/or use of iterative reconstruction technique. CONTRAST:  153mL OMNIPAQUE IOHEXOL 300 MG/ML  SOLN COMPARISON:  CT abdomen pelvis 06/26/2020 FINDINGS: CHEST: Ports and Devices: None. Lungs/airways: Bilateral lower lobe subsegmental atelectasis. No focal consolidation. No pulmonary nodule. No pulmonary mass. No pulmonary contusion or laceration. No pneumatocele formation. The central airways are patent. Pleura: No pleural effusion. No pneumothorax. No hemothorax. Lymph Nodes: No mediastinal, hilar, or axillary lymphadenopathy. Mediastinum: No pneumomediastinum. No aortic injury or mediastinal hematoma. No central pulmonary embolus. The main pulmonary artery is normal in caliber. The esophagus is unremarkable. The thyroid is unremarkable. Chest Wall / Breasts: No chest wall mass. Musculoskeletal: No acute rib or sternal fracture. No spinal fracture. ABDOMEN / PELVIS: Liver: Not enlarged. There is a persistent 1.7 cm fluid density lesion within the right hepatic lobe that is too small to characterize. Several subcentimeter grossly similar-appearing hypodensities too small to characterize. No focal lesion.  No laceration or subcapsular hematoma. Biliary System: Multiple calcified gallstones within the gallbladder lumen. No biliary ductal dilatation. Pancreas: Normal pancreatic contour. No main pancreatic duct dilatation. Spleen: Not enlarged. No focal lesion. No laceration, subcapsular hematoma, or vascular injury. Adrenal Glands: No nodularity bilaterally. Kidneys: Bilateral kidneys enhance symmetrically. No hydronephrosis. No contusion, laceration, or subcapsular hematoma. A 1.5 cm fluid density lesion within left kidney likely represents a simple renal cyst. No injury to the vascular structures or collecting systems. No hydroureter. The urinary bladder is unremarkable. Bowel: No small or large bowel wall thickening or dilatation. Stool throughout the bowel. The appendix is unremarkable. Mesentery, Omentum, and Peritoneum: No simple free fluid ascites. No pneumoperitoneum. No hemoperitoneum. No mesenteric hematoma identified. No organized fluid collection. Pelvic Organs: Couple of radiation seeds noted associated with the prostate. Lymph Nodes: No abdominal, pelvic, inguinal lymphadenopathy. Vasculature: Mild atherosclerotic plaque  no abdominal aorta or iliac aneurysm. No active contrast extravasation or pseudoaneurysm. Musculoskeletal: Small fat containing left inguinal hernia.  Fat inguinal hernia. There is a 10.4 x 5.3 cm lesion within the right gluteal soft tissues with a density of 42 Hounsfield units that is consistent with a hematoma. No acute pelvic fracture. Please see separately dictated CT lumbar spine 02/13/2021. IMPRESSION: 1. No acute traumatic injury to the chest, abdomen, or pelvis. 2. No acute fracture or traumatic malalignment of the thoracic spine. 3. Right gluteal soft tissue 10 x 5 cm hematoma. 4. Please see separately dictated CT lumbar spine 02/13/2021. 5. Other imaging findings of potential clinical significance: Cholelithiasis with no acute cholecystitis. Stool throughout the colon. Aortic  Atherosclerosis (ICD10-I70.0). Electronically Signed   By: Iven Finn M.D.   On: 02/13/2021 19:26   CT L-SPINE NO CHARGE  Result Date: 02/13/2021 CLINICAL DATA:  Fall EXAM: CT LUMBAR SPINE WITHOUT CONTRAST TECHNIQUE: Multidetector CT imaging of the lumbar spine was performed without intravenous contrast administration. Multiplanar CT image reconstructions were also generated. RADIATION DOSE REDUCTION: This exam was performed according to the departmental dose-optimization program which includes automated exposure control, adjustment of the mA and/or kV according to patient size and/or use of iterative reconstruction technique. COMPARISON:  None. FINDINGS: Segmentation: 5 lumbar type vertebrae. Alignment: Normal. Vertebrae: Multilevel facet arthropathy. Mild osteophyte formation. No acute fracture or focal pathologic process. Paraspinal and other soft tissues: Negative. Disc levels: Intervertebral disc space vacuum phenomenon at the L4-L5 level. IMPRESSION: 1. No acute displaced fracture or traumatic listhesis of the lumbar spine. 2. Please see separately dictated CT chest, abdomen, pelvis. Electronically Signed   By: Iven Finn M.D.   On: 02/13/2021 19:27   DG Hip Unilat W or Wo Pelvis 2-3 Views Right  Result Date: 02/13/2021 CLINICAL DATA:  Right hip pain after fall from ladder. EXAM: DG HIP (WITH OR WITHOUT PELVIS) 2-3V RIGHT COMPARISON:  None. FINDINGS: There is no evidence of hip fracture or dislocation. There is no evidence of arthropathy or other focal bone abnormality. There is lateral soft tissue swelling. There are moderate degenerative changes of the bilateral hips. Prostate radiotherapy seeds are present. There is contrast in the bladder. IMPRESSION: 1. No acute fracture or dislocation of the right hip. 2. Lateral right hip soft tissue swelling. 3. Moderate degenerative changes of the bilateral hips. Electronically Signed   By: Ronney Asters M.D.   On: 02/13/2021 19:23     Procedures Procedures    Medications Ordered in ED Medications  morphine 4 MG/ML injection 4 mg (4 mg Intravenous Given 02/13/21 1739)  ondansetron (ZOFRAN) injection 4 mg (4 mg Intravenous Given 02/13/21 1739)  iohexol (OMNIPAQUE) 300 MG/ML solution 125 mL (125 mLs Intravenous Contrast Given 02/13/21 1839)    ED Course/ Medical Decision Making/ A&P                           Medical Decision Making DENNISE BAMBER is a 66 y.o. male here presenting with fall with right hip and elbow pain and headache.  Patient had a mechanical fall from 7 feet.  Patient did hit his head.  He also has chest pain as well.  Concern for possible intracranial bleeding versus rib fractures versus pneumothorax versus spinal fractures versus hip fracture.  Plan to get CBC and CMP and troponin and CT head neck and chest abdomen pelvis.  We will also get dedicated right hip x-ray and right elbow x-ray.  7:44 PM Patient has no hip fracture or elbow fracture.  I reviewed CT head neck and chest abdomen pelvis.  Patient does have a right gluteal hematoma with no fracture.  Pain is under control now.  Will discharge home with pain medicine.   Problems Addressed: Hematoma and contusion: acute illness or injury  Amount and/or Complexity of Data Reviewed External Data Reviewed: notes. Labs: ordered. Decision-making details documented in ED Course. Radiology: ordered and independent interpretation performed. Decision-making details documented in ED Course. ECG/medicine tests: ordered and independent interpretation performed. Decision-making details documented in ED Course.  Risk Prescription drug management.   Final Clinical Impression(s) / ED Diagnoses Final diagnoses:  Hematoma and contusion    Rx / DC Orders ED Discharge Orders     None         Drenda Freeze, MD 02/13/21 1945

## 2021-02-13 NOTE — Discharge Instructions (Signed)
Take Motrin and tylenol for pain   Take vicodin for severe pain   He has a hematoma in the right hip area.   Apply ice to the area  See your doctor for follow-up.  Your CT scan today did not show any fracture or bleeding  Return to ER if you have worse hip pain, headache, vomiting.

## 2021-02-13 NOTE — ED Triage Notes (Signed)
Fall from ladder approx 7 ft  States buttocks hit first, then head, then elbow States black out x 30 sec Right elbow/shoulder pain from fall

## 2023-06-05 IMAGING — CT CT L SPINE W/O CM
3 of 5 series · 14 of 33 positions shown, 16 images · IV contrast (Omnipaque)
Comparison: None.

CLINICAL DATA: Fall



[Series 1: lumbar axial st · axial · 0.41mm/px · z∈[-619,-381]mm · 7 of 141 slices shown, 9 images]
[im 11/141  soft-tissue]
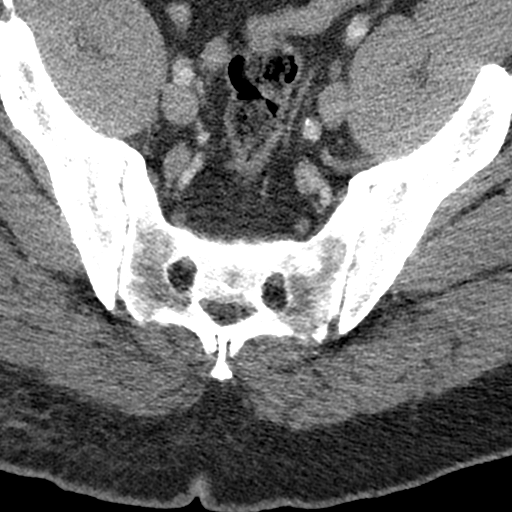
[im 11/141  bone]
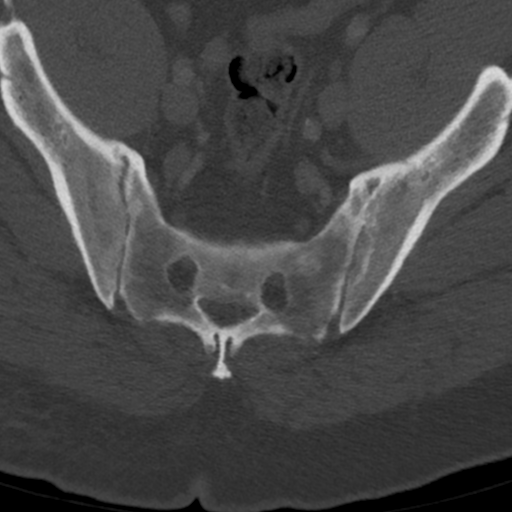
[im 33/141  bone]
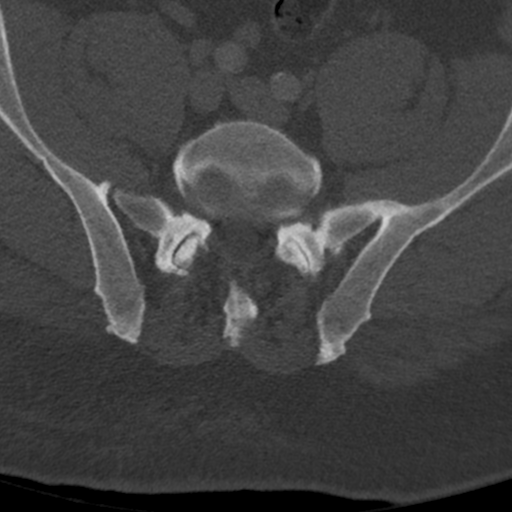
[im 54/141  bone]
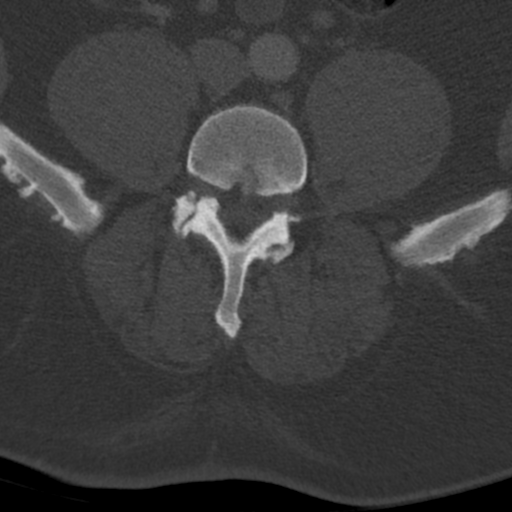
[im 76/141  bone]
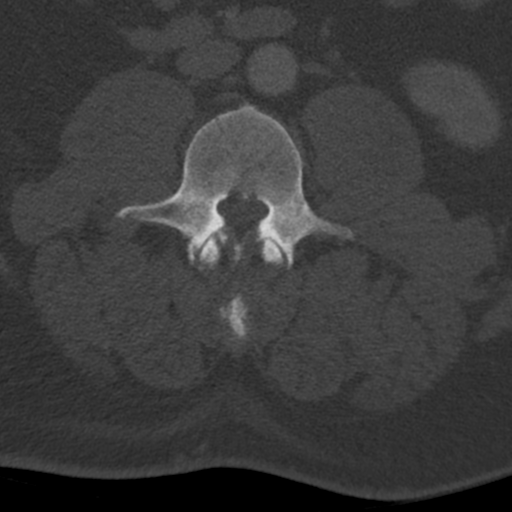
[im 87/141  soft-tissue]
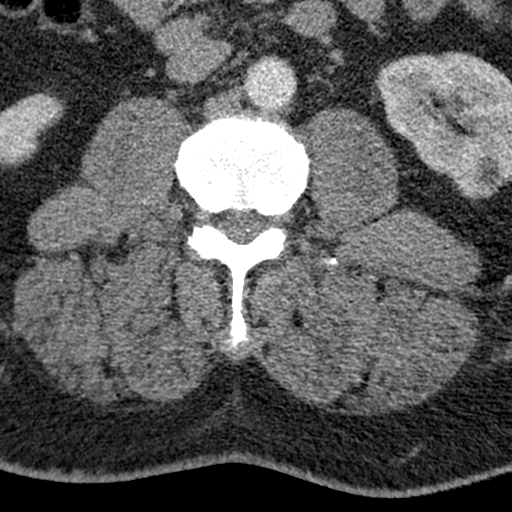
[im 87/141  bone]
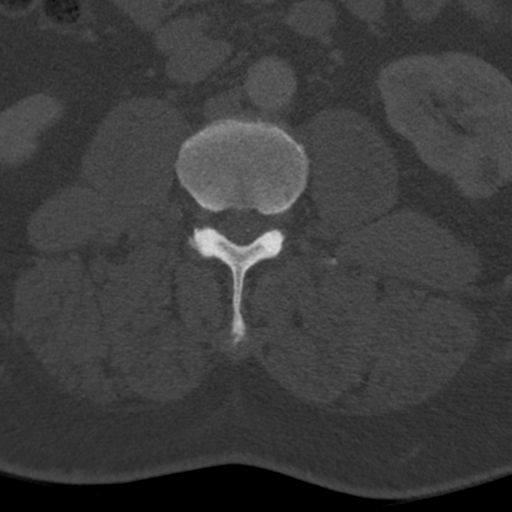
[im 108/141  bone]
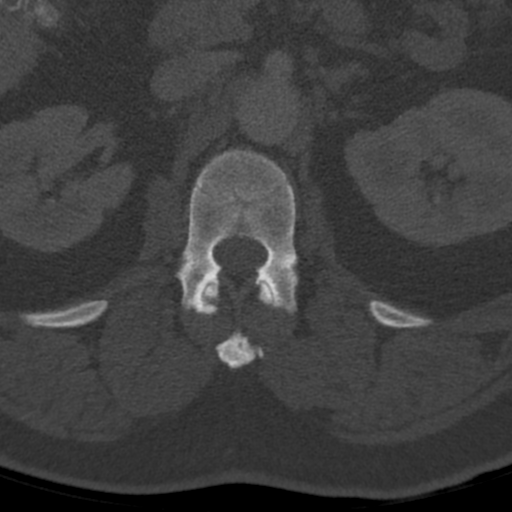
[im 130/141  bone]
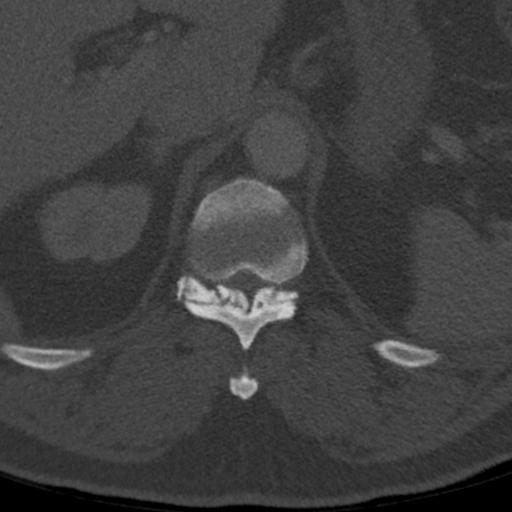

[Series 5: coronals · coronal · 0.93mm/px · 2 of 169 slices shown]
[im 57/169  bone]
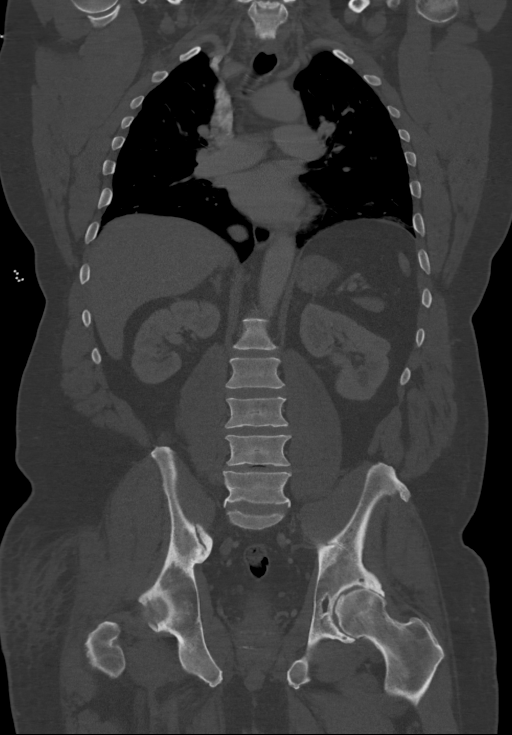
[im 113/169  bone]
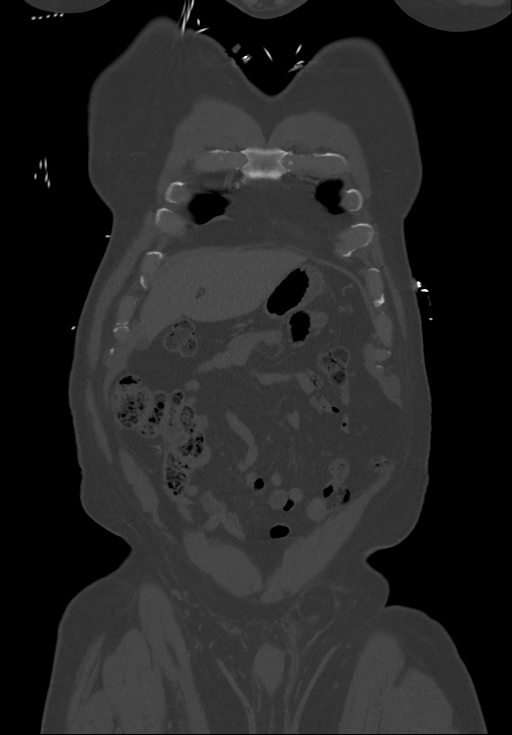

[Series 6: sagittals · sagittal · 0.91mm/px · 5 of 203 slices shown]
[im 51/203  bone]
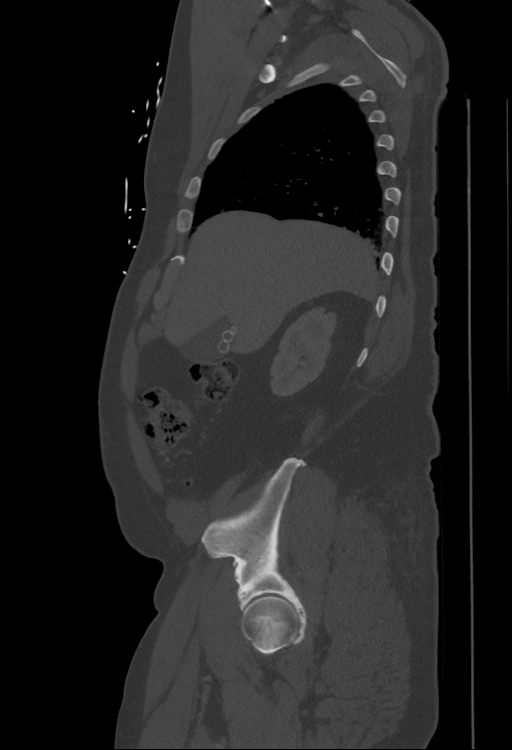
[im 76/203  bone]
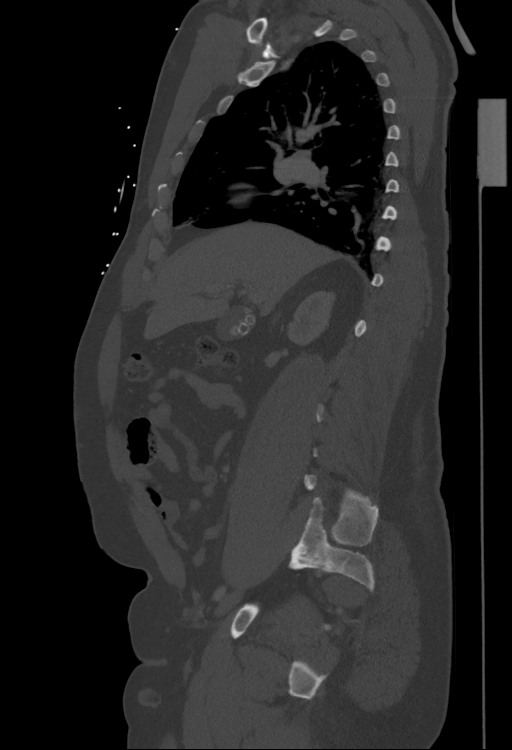
[im 102/203  bone]
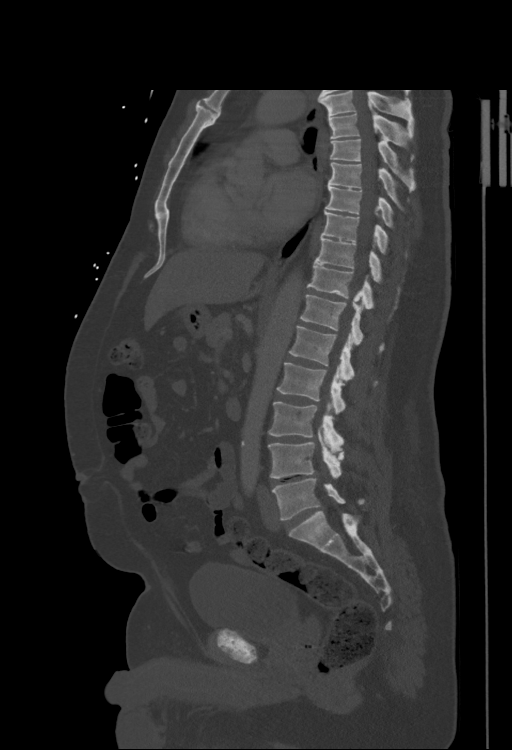
[im 127/203  bone]
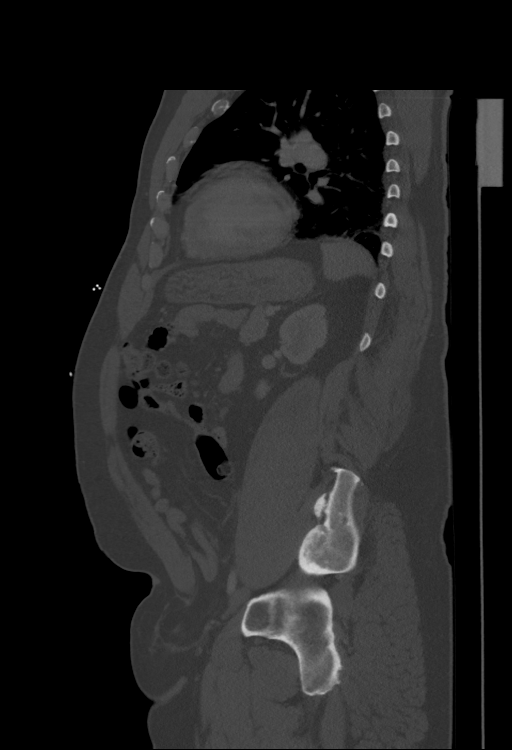
[im 152/203  bone]
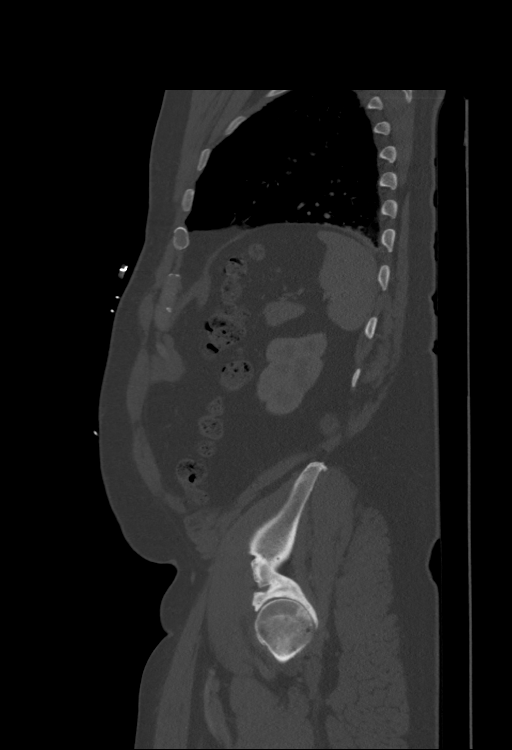

[14 of 33 positions shown; findings below may reference images not displayed]

FINDINGS: Segmentation: 5 lumbar type vertebrae.

Alignment: Normal.

Vertebrae: Multilevel facet arthropathy. Mild osteophyte formation.
No acute fracture or focal pathologic process.

Paraspinal and other soft tissues: Negative.

Disc levels: Intervertebral disc space vacuum phenomenon at the
L4-L5 level.
IMPRESSION: 1. No acute displaced fracture or traumatic listhesis of the lumbar
spine.
2. Please see separately dictated CT chest, abdomen, pelvis.

## 2023-06-05 IMAGING — CT CT CERVICAL SPINE W/O CM
3 of 4 series · 13 of 33 positions shown, 16 images · non-contrast
Comparison: None.

CLINICAL DATA: Fall from ladder with headaches and neck pain,
initial encounter



[Series 5: coronals · coronal · 0.25mm/px · 3 of 61 slices shown]
[im 14/61  bone]
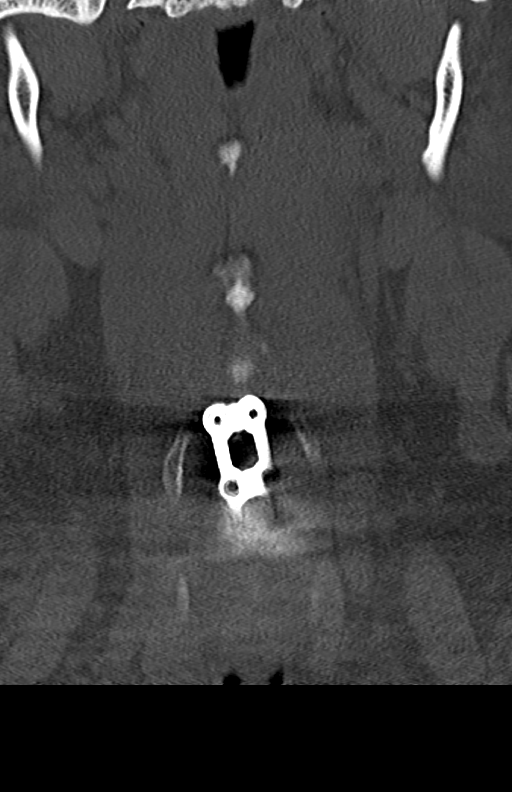
[im 25/61  bone]
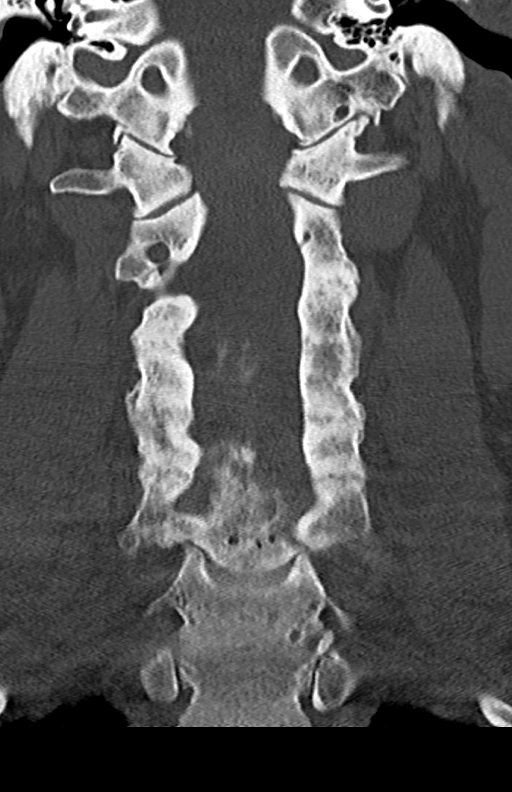
[im 36/61  bone]
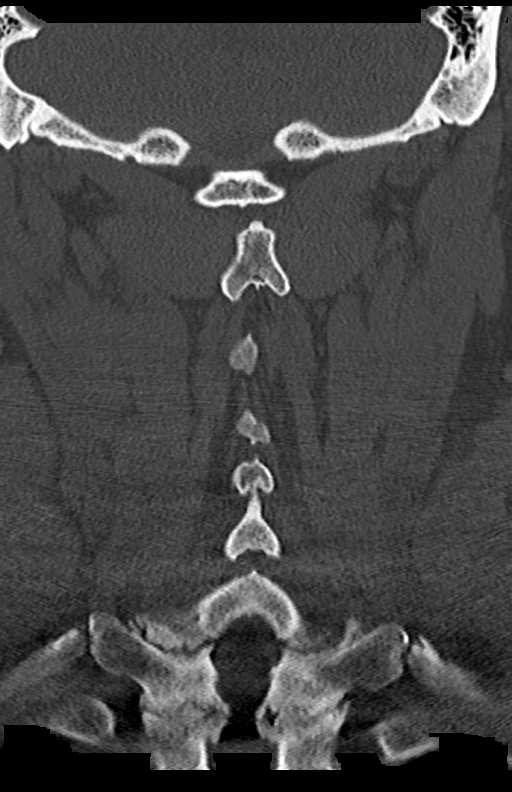

[Series 6: sagittals · sagittal · 0.33mm/px · 5 of 61 slices shown, 6 images]
[im 21/61  bone]
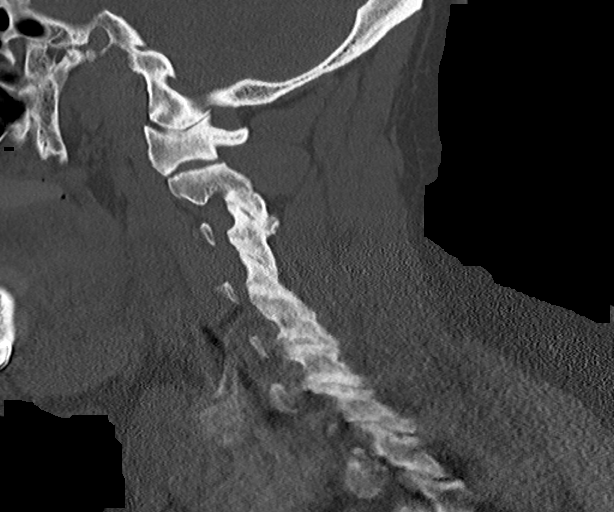
[im 26/61  bone]
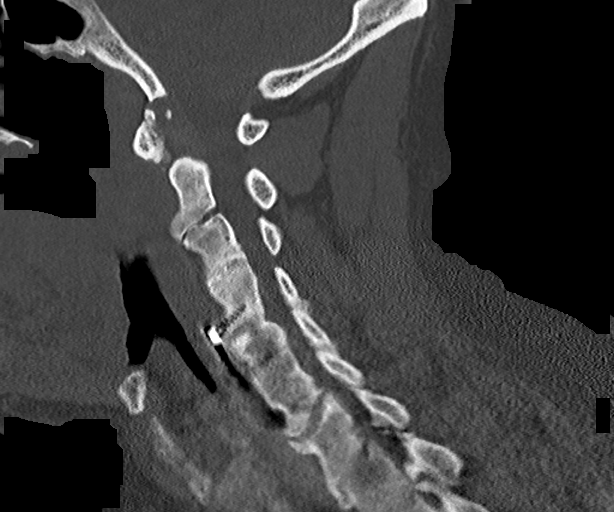
[im 31/61  soft-tissue]
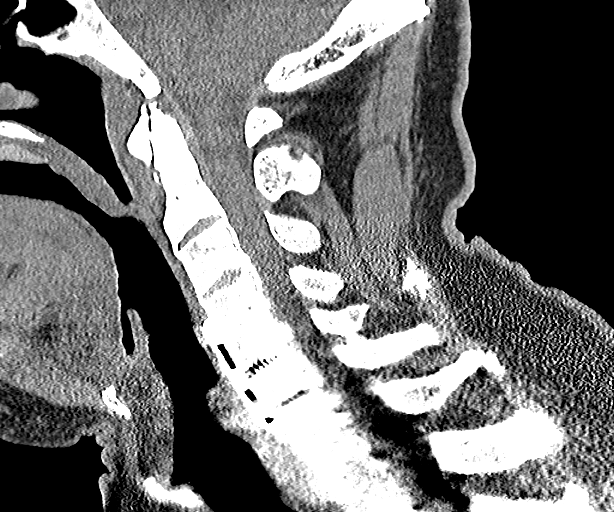
[im 31/61  bone]
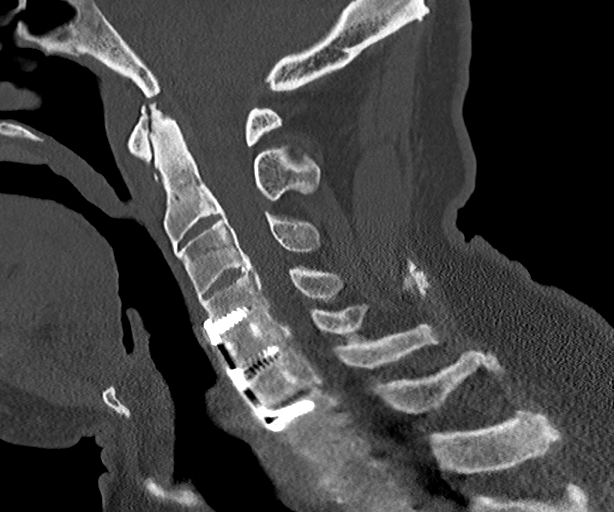
[im 36/61  bone]
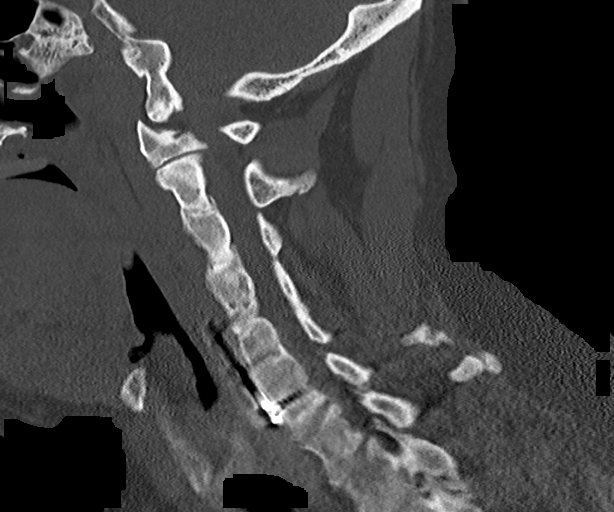
[im 41/61  bone]
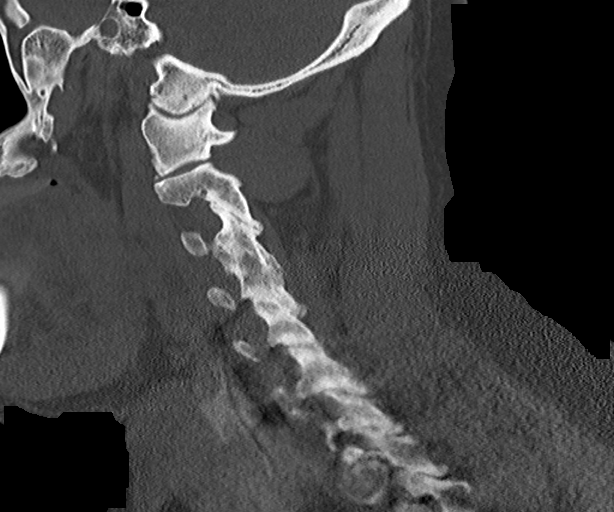

[Series 7: orthogonals · axial · 0.23mm/px · z∈[-165,-33]mm · 5 of 106 slices shown, 7 images]
[im 16/106  soft-tissue]
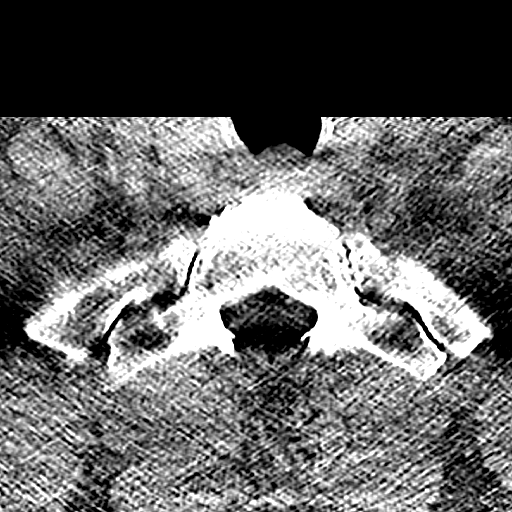
[im 16/106  bone]
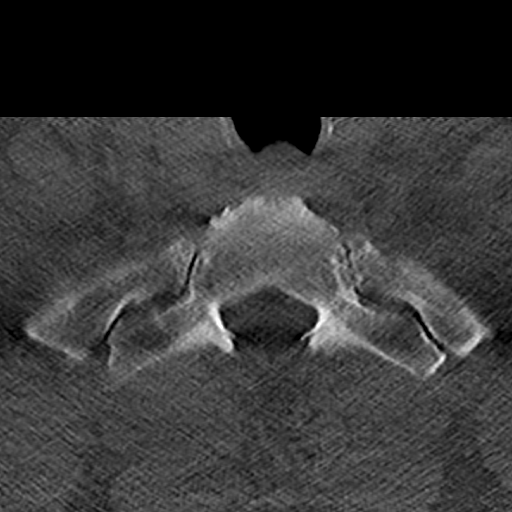
[im 31/106  bone]
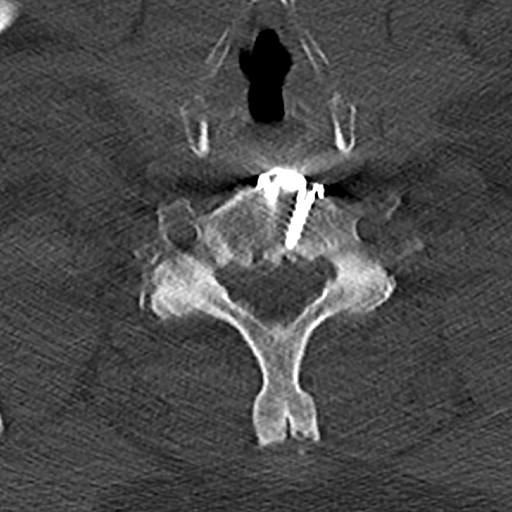
[im 61/106  bone]
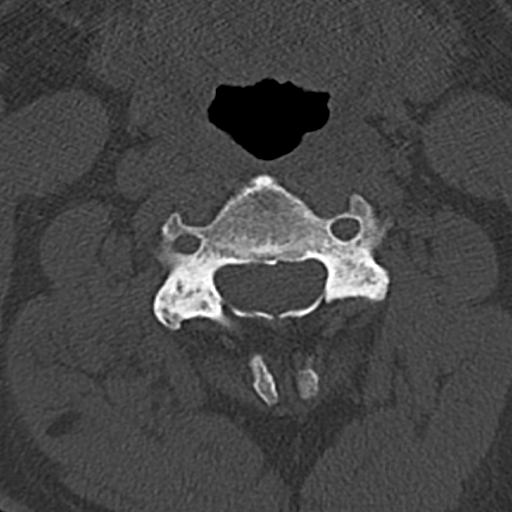
[im 76/106  bone]
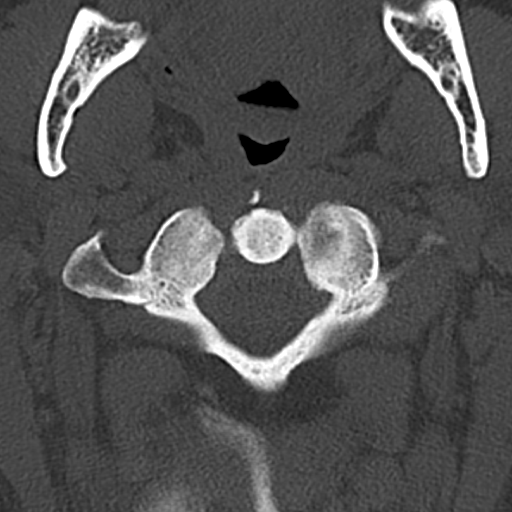
[im 91/106  soft-tissue]
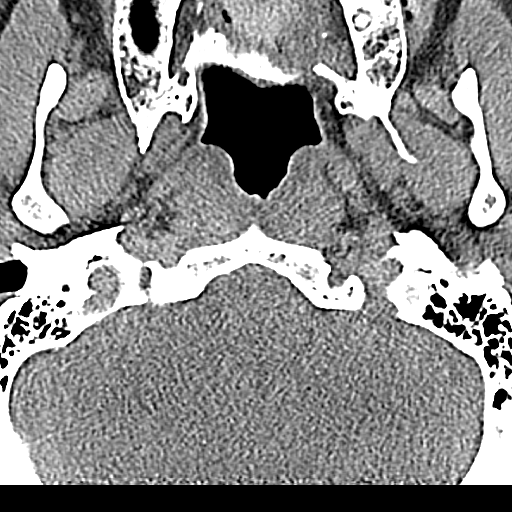
[im 91/106  bone]
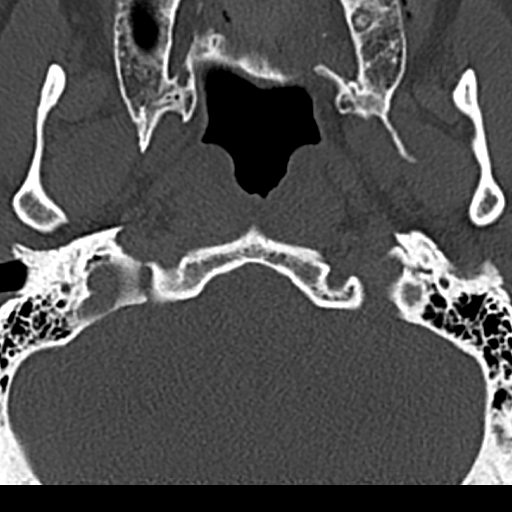

[13 of 33 positions shown; findings below may reference images not displayed]

FINDINGS: CT HEAD FINDINGS

Brain: No evidence of acute infarction, hemorrhage, hydrocephalus,
extra-axial collection or mass lesion/mass effect.

Vascular: No hyperdense vessel or unexpected calcification.

Skull: Normal. Negative for fracture or focal lesion.

Sinuses/Orbits: No acute finding.

Other: None.

CT CERVICAL SPINE FINDINGS

Alignment: Within normal limits.

Skull base and vertebrae: 7 cervical segments are well visualized.
Prior fusion at C4-5 and C5-6 is noted with anterior fixation.
Multilevel facet hypertrophic changes are noted. No anterolisthesis
is seen. No acute fracture or acute facet abnormality is noted.

Soft tissues and spinal canal: Surrounding soft tissue structures
are within normal limits

Upper chest: Visualized lung apices are unremarkable.

Other: None
IMPRESSION: CT of the head: No acute intracranial abnormality noted.

CT of the cervical spine: Degenerative and postoperative changes
without acute abnormality.

## 2023-06-05 IMAGING — CT CT CHEST-ABD-PELV W/ CM
2 of 5 series · 13 of 36 positions shown, 15 images · IV contrast (Omnipaque)
Comparison: CT abdomen pelvis 06/26/2020

CLINICAL DATA: Chest trauma, blunt.  Status post fall

EXAM:
CT CHEST, ABDOMEN, AND PELVIS WITH CONTRAST
TECHNIQUE: Multidetector CT imaging of the chest, abdomen and pelvis was
performed following the standard protocol during bolus
administration of intravenous contrast.

[Series 2: cap with 2 · axial · 0.91mm/px · z∈[-734,-174]mm · 10 of 138 slices shown, 12 images]
[im 13/138  mediastinal]
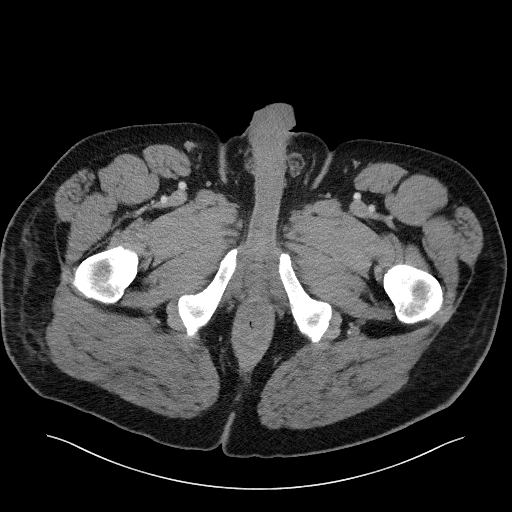
[im 13/138  bone]
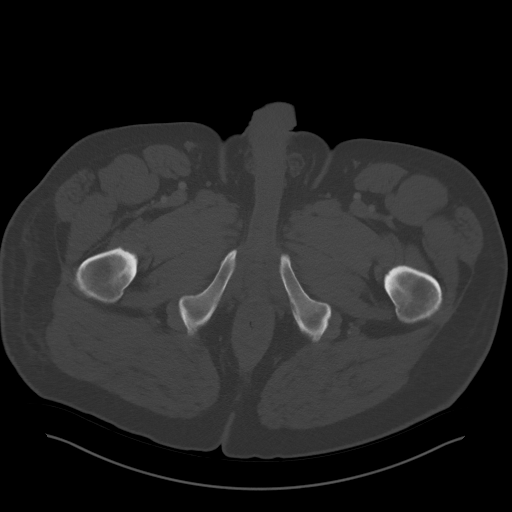
[im 25/138  mediastinal]
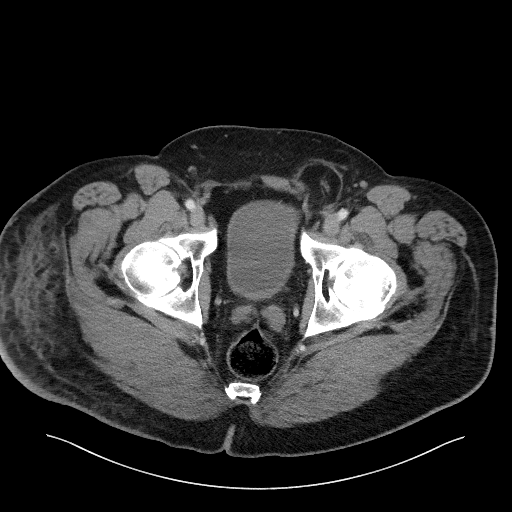
[im 38/138  mediastinal]
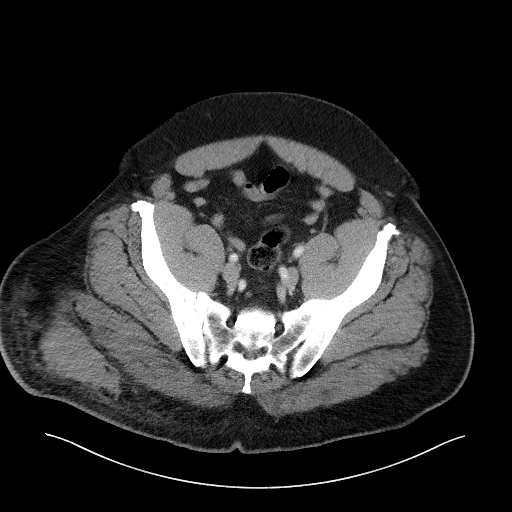
[im 50/138  mediastinal]
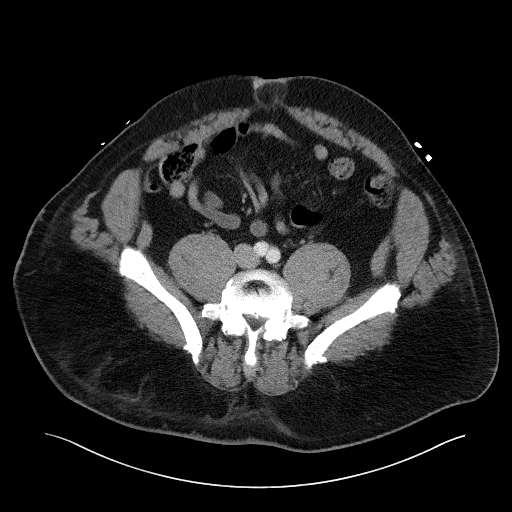
[im 63/138  mediastinal]
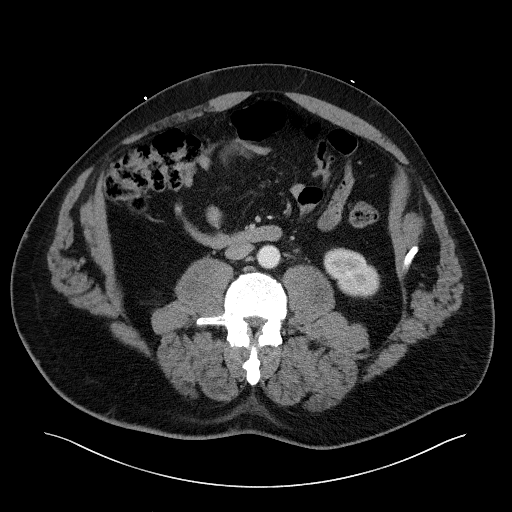
[im 75/138  mediastinal]
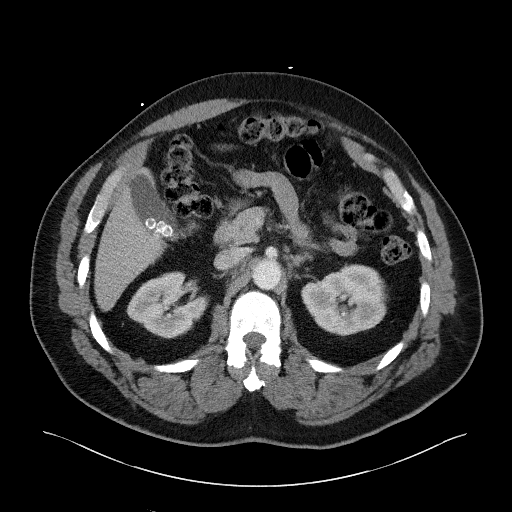
[im 88/138  mediastinal]
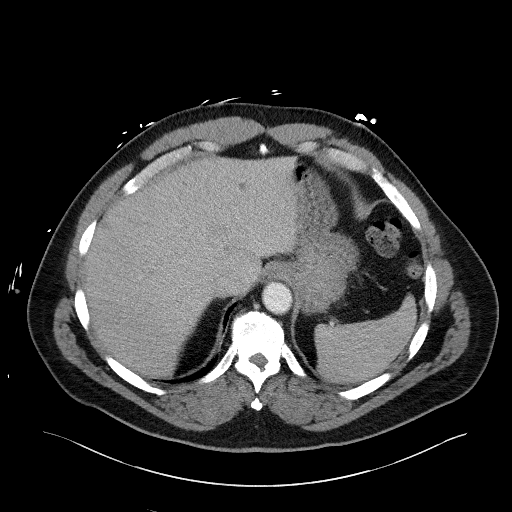
[im 100/138  mediastinal]
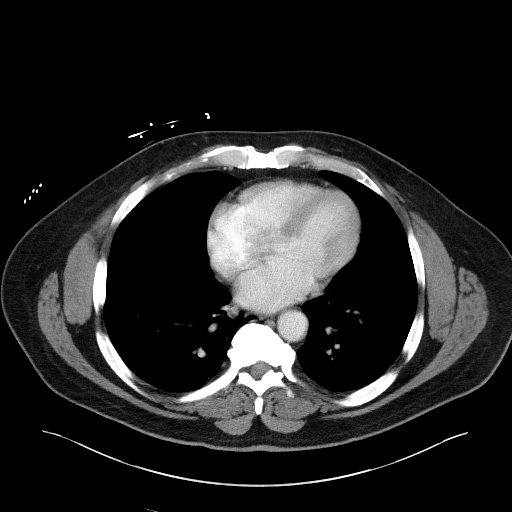
[im 113/138  mediastinal]
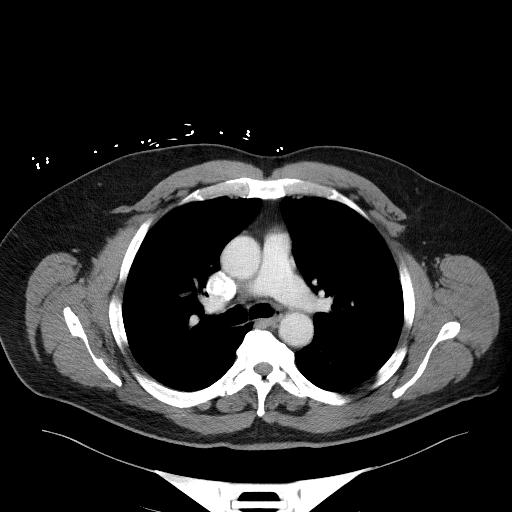
[im 113/138  bone]
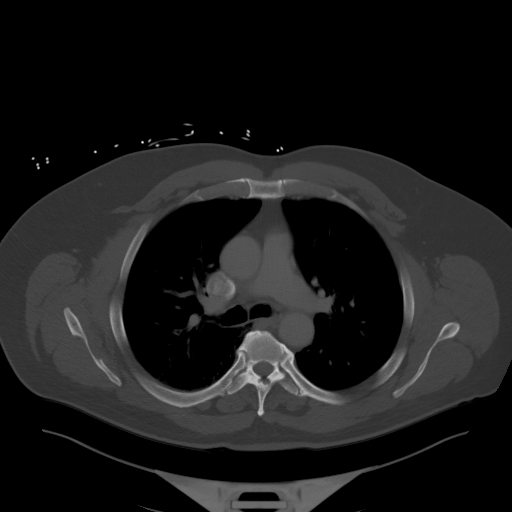
[im 125/138  mediastinal]
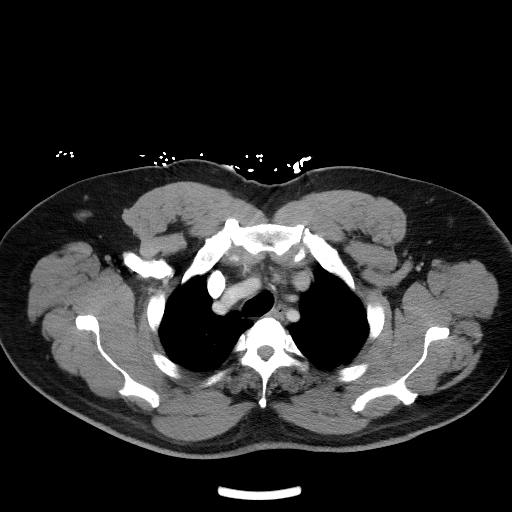

[Series 5: coronals · coronal · 0.93mm/px · 3 of 169 slices shown]
[im 34/169  mediastinal]
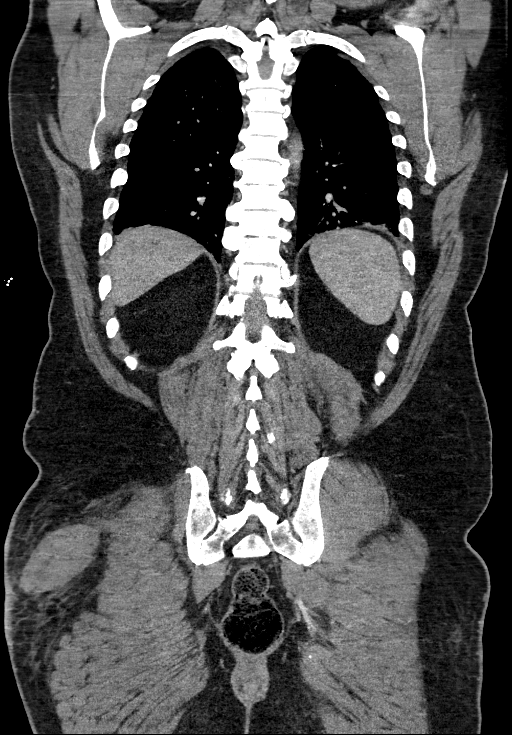
[im 68/169  mediastinal]
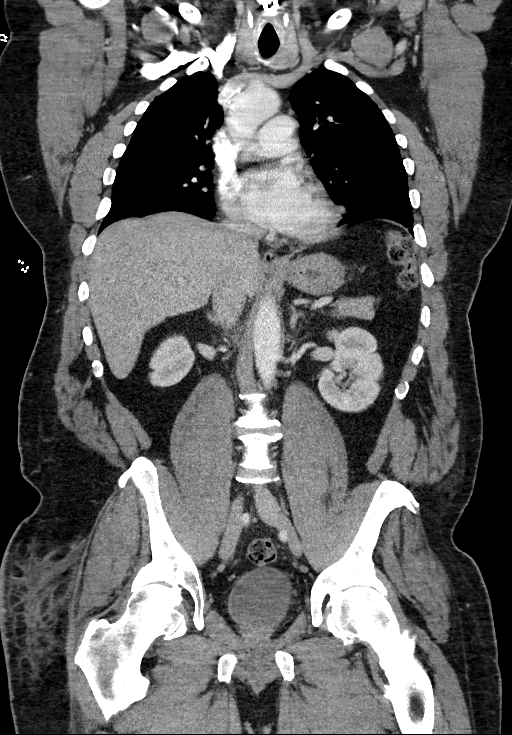
[im 101/169  mediastinal]
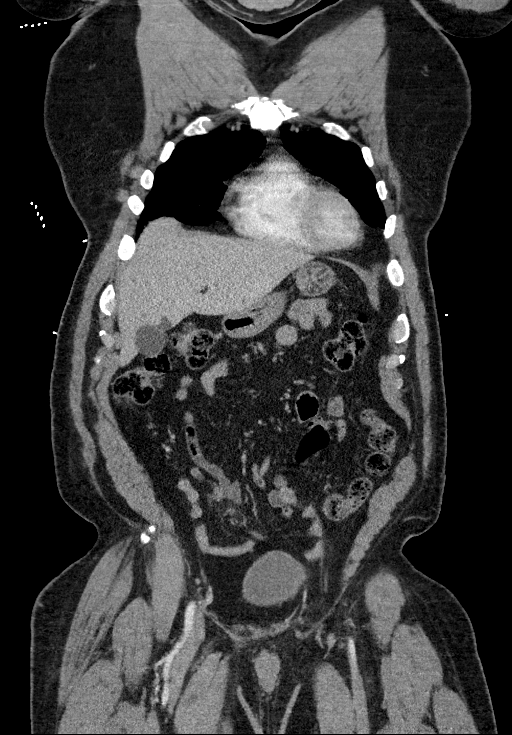

[13 of 36 positions shown; findings below may reference images not displayed]

RADIATION DOSE REDUCTION: This exam was performed according to the
departmental dose-optimization program which includes automated
exposure control, adjustment of the mA and/or kV according to
patient size and/or use of iterative reconstruction technique.

CONTRAST:  125mL OMNIPAQUE IOHEXOL 300 MG/ML  SOLN
FINDINGS: CHEST:
Ports and Devices: None.

Lungs/airways:

Bilateral lower lobe subsegmental atelectasis. No focal
consolidation. No pulmonary nodule. No pulmonary mass. No pulmonary
contusion or laceration. No pneumatocele formation.

The central airways are patent.

Pleura: No pleural effusion. No pneumothorax. No hemothorax.

Lymph Nodes: No mediastinal, hilar, or axillary lymphadenopathy.

Mediastinum:

No pneumomediastinum. No aortic injury or mediastinal hematoma.

No central pulmonary embolus. The main pulmonary artery is normal in
caliber.

The esophagus is unremarkable.

The thyroid is unremarkable.

Chest Wall / Breasts: No chest wall mass.

Musculoskeletal: No acute rib or sternal fracture. No spinal
fracture.

ABDOMEN / PELVIS:
Liver: Not enlarged. There is a persistent 1.7 cm fluid density
lesion within the right hepatic lobe that is too small to
characterize. Several subcentimeter grossly similar-appearing
hypodensities too small to characterize. No focal lesion. No
laceration or subcapsular hematoma.

Biliary System: Multiple calcified gallstones within the gallbladder
lumen. No biliary ductal dilatation.

Pancreas: Normal pancreatic contour. No main pancreatic duct
dilatation.

Spleen: Not enlarged. No focal lesion. No laceration, subcapsular
hematoma, or vascular injury.

Adrenal Glands: No nodularity bilaterally.

Kidneys:

Bilateral kidneys enhance symmetrically. No hydronephrosis. No
contusion, laceration, or subcapsular hematoma. A 1.5 cm fluid
density lesion within left kidney likely represents a simple renal
cyst.

No injury to the vascular structures or collecting systems. No
hydroureter.

The urinary bladder is unremarkable.

Bowel: No small or large bowel wall thickening or dilatation. Stool
throughout the bowel. The appendix is unremarkable.

Mesentery, Omentum, and Peritoneum: No simple free fluid ascites. No
pneumoperitoneum. No hemoperitoneum. No mesenteric hematoma
identified. No organized fluid collection.

Pelvic Organs: Couple of radiation seeds noted associated with the
prostate.

Lymph Nodes: No abdominal, pelvic, inguinal lymphadenopathy.

Vasculature: Mild atherosclerotic plaque no abdominal aorta or iliac
aneurysm. No active contrast extravasation or pseudoaneurysm.

Musculoskeletal:

Small fat containing left inguinal hernia.  Fat inguinal hernia.

There is a 10.4 x 5.3 cm lesion within the right gluteal soft
tissues with a density of 42 Hounsfield units that is consistent
with a hematoma.

No acute pelvic fracture. Please see separately dictated CT lumbar
spine 02/13/2021.
IMPRESSION: 1. No acute traumatic injury to the chest, abdomen, or pelvis.

2. No acute fracture or traumatic malalignment of the thoracic
spine.
3. Right gluteal soft tissue 10 x 5 cm hematoma.
4. Please see separately dictated CT lumbar spine 02/13/2021.
5. Other imaging findings of potential clinical significance:
Cholelithiasis with no acute cholecystitis. Stool throughout the
colon. Aortic Atherosclerosis ([id number]).

## 2023-10-12 ENCOUNTER — Other Ambulatory Visit: Payer: Self-pay | Admitting: Neurological Surgery

## 2023-11-16 NOTE — Progress Notes (Signed)
 Surgical Instructions   Your procedure is scheduled on Thursday, October 23rd. Report to [facility] Main Entrance A at 5:30 A.M., then check in with the Admitting office. Any questions or running late day of surgery: call [phone]  Questions prior to your surgery date: call [phone], Monday-Friday, 8am-4pm. If you experience any cold or flu symptoms such as cough, fever, chills, shortness of breath, etc. between now and your scheduled surgery, please notify us  at the above number.     Remember:  Do not eat or drink after midnight the night before your surgery   Take these medicines the morning of surgery with A SIP OF WATER: NONE   May take these medicines IF NEEDED: albuterol (VENTOLIN HFA) inhaler - bring with you on day of surgery  budesonide-formoterol (SYMBICORT) inhlaer    One week prior to surgery, STOP taking any Aspirin (unless otherwise instructed by your surgeon) Aleve , Naproxen , Ibuprofen, Motrin, Advil, Goody's, BC's, all herbal medications, fish oil, and non-prescription vitamins.                     Do NOT Smoke (Tobacco/Vaping) for 24 hours prior to your procedure.  If you use a CPAP at night, you may bring your mask/headgear for your overnight stay.   You will be asked to remove any contacts, glasses, piercing's, hearing aid's, dentures/partials prior to surgery. Please bring cases for these items if needed.    Patients discharged the day of surgery will not be allowed to drive home, and someone needs to stay with them for 24 hours.  SURGICAL WAITING ROOM VISITATION Patients may have no more than 2 support people in the waiting area - these visitors may rotate.   Pre-op nurse will coordinate an appropriate time for 1 ADULT support person, who may not rotate, to accompany patient in pre-op.  Children under the age of 37 must have an adult with them who is not the patient and must remain in the main waiting area with an adult.  If the patient needs to  stay at the hospital during part of their recovery, the visitor guidelines for inpatient rooms apply.  Please refer to the [facility] website for the visitor guidelines for any additional information.   If you received a COVID test during your pre-op visit  it is requested that you wear a mask when out in public, stay away from anyone that may not be feeling well and notify your surgeon if you develop symptoms. If you have been in contact with anyone that has tested positive in the last 10 days please notify you surgeon.      Pre-operative 4 CHG Bathing Instructions   You can play a key role in reducing the risk of infection after surgery. Your skin needs to be as free of germs as possible. You can reduce the number of germs on your skin by washing with CHG (chlorhexidine gluconate) soap before surgery. CHG is an antiseptic soap that kills germs and continues to kill germs even after washing.   DO NOT use if you have an allergy to chlorhexidine/CHG or antibacterial soaps. If your skin becomes reddened or irritated, stop using the CHG and notify one of our RNs at [phone].   Please shower with the CHG soap starting 4 days before surgery using the following schedule:     Please keep in mind the following:  You may shave your face at any point before/day of surgery.  Place clean sheets on your bed  the day you start using CHG soap. Use a clean washcloth (not used since being washed) for each shower. DO NOT sleep with pets once you start using the CHG.   CHG Shower Instructions:  Wash your face and private area with normal soap. If you choose to wash your hair, wash first with your normal shampoo.  After you use shampoo/soap, rinse your hair and body thoroughly to remove shampoo/soap residue.  Turn the water OFF and apply  bottle of CHG soap to a CLEAN washcloth.  Apply CHG soap ONLY FROM YOUR NECK DOWN TO YOUR TOES (washing for 3-5 minutes)  DO NOT use CHG soap on face, private  areas, open wounds, or sores.  Pay special attention to the area where your surgery is being performed.  If you are having back surgery, having someone wash your back for you may be helpful. Wait 2 minutes after CHG soap is applied, then you may rinse off the CHG soap.  Pat dry with a clean towel  Put on clean clothes/pajamas   If you choose to wear lotion, please use ONLY the CHG-compatible lotions that are listed below.  Additional instructions for the day of surgery:  If you choose, you may shower the morning of surgery with an antibacterial soap.  DO NOT APPLY any lotions, deodorants, or cologne.   Do not bring valuables to the hospital. [facility] is not responsible for any belongings/valuables. Do not wear jewelry Put on clean/comfortable clothes.  Please brush your teeth.  Ask your nurse before applying any prescription medications to the skin.     CHG Compatible Lotions   Aveeno Moisturizing lotion  Cetaphil Moisturizing Cream  Cetaphil Moisturizing Lotion  Clairol Herbal Essence Moisturizing Lotion, Dry Skin  Clairol Herbal Essence Moisturizing Lotion, Extra Dry Skin  Clairol Herbal Essence Moisturizing Lotion, Normal Skin  Curel Age Defying Therapeutic Moisturizing Lotion with Alpha Hydroxy  Curel Extreme Care Body Lotion  Curel Soothing Hands Moisturizing Hand Lotion  Curel Therapeutic Moisturizing Cream, Fragrance-Free  Curel Therapeutic Moisturizing Lotion, Fragrance-Free  Curel Therapeutic Moisturizing Lotion, Original Formula  Eucerin Daily Replenishing Lotion  Eucerin Dry Skin Therapy Plus Alpha Hydroxy Crme  Eucerin Dry Skin Therapy Plus Alpha Hydroxy Lotion  Eucerin Original Crme  Eucerin Original Lotion  Eucerin Plus Crme Eucerin Plus Lotion  Eucerin TriLipid Replenishing Lotion  Keri Anti-Bacterial Hand Lotion  Keri Deep Conditioning Original Lotion Dry Skin Formula Softly Scented  Keri Deep Conditioning Original Lotion, Fragrance Free Sensitive  Skin Formula  Keri Lotion Fast Absorbing Fragrance Free Sensitive Skin Formula  Keri Lotion Fast Absorbing Softly Scented Dry Skin Formula  Keri Original Lotion  Keri Skin Renewal Lotion Keri Silky Smooth Lotion  Keri Silky Smooth Sensitive Skin Lotion  Nivea Body Creamy Conditioning Oil  Nivea Body Extra Enriched Lotion  Nivea Body Original Lotion  Nivea Body Sheer Moisturizing Lotion Nivea Crme  Nivea Skin Firming Lotion  NutraDerm 30 Skin Lotion  NutraDerm Skin Lotion  NutraDerm Therapeutic Skin Cream  NutraDerm Therapeutic Skin Lotion  ProShield Protective Hand Cream  Provon moisturizing lotion  Please read over the following fact sheets that you were given.

## 2023-11-17 ENCOUNTER — Encounter (HOSPITAL_COMMUNITY)
Admission: RE | Admit: 2023-11-17 | Discharge: 2023-11-17 | Disposition: A | Discharge status: Home / Self Care | Source: Ambulatory Visit | Attending: Neurological Surgery | Admitting: Neurological Surgery

## 2023-11-17 ENCOUNTER — Encounter (HOSPITAL_COMMUNITY): Payer: Self-pay

## 2023-11-17 ENCOUNTER — Other Ambulatory Visit: Payer: Self-pay

## 2023-11-17 VITALS — BP 148/66 | HR 64 | Temp 98.0°F | Resp 18 | Ht 72.0 in | Wt 278.9 lb

## 2023-11-17 DIAGNOSIS — R9431 Abnormal electrocardiogram [ECG] [EKG]: Secondary | ICD-10-CM | POA: Diagnosis not present

## 2023-11-17 DIAGNOSIS — Z01818 Encounter for other preprocedural examination: Secondary | ICD-10-CM | POA: Insufficient documentation

## 2023-11-17 LAB — TYPE AND SCREEN
ABO/RH(D): A POS
Antibody Screen: NEGATIVE

## 2023-11-17 LAB — SURGICAL PCR SCREEN

## 2023-11-17 NOTE — Progress Notes (Signed)
 PCP - Lonni Loots Cardiologist - Leotis Sella NP lov 07-01-23 follow up in 1 year  PPM/ICD - Denies Device Orders - n/a Rep Notified - n/a  Chest x-ray - n/a EKG - 11-17-23 Stress Test - 11-15-19 ECHO - 10-26-19 Cardiac Cath - denies  Sleep Study - denies CPAP - n/a  NON-diabetic  Last dose of GLP1 agonist-  denies GLP1 instructions: n/a  Blood Thinner Instructions: denies Aspirin Instructions:denies  ERAS Protcol - npo PRE-SURGERY Ensure or G2- n/a  COVID TEST- n/a   Anesthesia review: yes, HTN,   Patient denies shortness of breath, fever, cough and chest pain at PAT appointment.  CBC and CMP are in epic on 11-02-23.   All instructions explained to the patient, with a verbal understanding of the material. Patient agrees to go over the instructions while at home for a better understanding. Patient also instructed to self quarantine after being tested for COVID-19. The opportunity to ask questions was provided.

## 2023-11-18 NOTE — Progress Notes (Signed)
 Surgical PCR result invalid. Will need to be recollected DOS. Order placed.

## 2023-11-19 ENCOUNTER — Other Ambulatory Visit: Payer: Self-pay

## 2023-11-19 ENCOUNTER — Ambulatory Visit (HOSPITAL_COMMUNITY)

## 2023-11-19 ENCOUNTER — Ambulatory Visit (HOSPITAL_COMMUNITY): Admitting: Anesthesiology

## 2023-11-19 ENCOUNTER — Encounter (HOSPITAL_COMMUNITY): Payer: Self-pay | Admitting: Neurological Surgery

## 2023-11-19 ENCOUNTER — Ambulatory Visit (HOSPITAL_COMMUNITY): Admitting: Physician Assistant

## 2023-11-19 ENCOUNTER — Encounter (HOSPITAL_COMMUNITY): Admission: RE | Disposition: A | Payer: Self-pay | Source: Home / Self Care | Attending: Neurological Surgery

## 2023-11-19 ENCOUNTER — Observation Stay (HOSPITAL_COMMUNITY)
Admission: RE | Admit: 2023-11-19 | Discharge: 2023-11-20 | Disposition: A | Discharge status: Home / Self Care | Attending: Neurological Surgery | Admitting: Neurological Surgery

## 2023-11-19 DIAGNOSIS — M4714 Other spondylosis with myelopathy, thoracic region: Secondary | ICD-10-CM | POA: Diagnosis present

## 2023-11-19 DIAGNOSIS — M47814 Spondylosis without myelopathy or radiculopathy, thoracic region: Secondary | ICD-10-CM

## 2023-11-19 DIAGNOSIS — Z7982 Long term (current) use of aspirin: Secondary | ICD-10-CM | POA: Diagnosis not present

## 2023-11-19 DIAGNOSIS — Z01818 Encounter for other preprocedural examination: Principal | ICD-10-CM

## 2023-11-19 DIAGNOSIS — Z79899 Other long term (current) drug therapy: Secondary | ICD-10-CM | POA: Insufficient documentation

## 2023-11-19 DIAGNOSIS — I10 Essential (primary) hypertension: Secondary | ICD-10-CM | POA: Insufficient documentation

## 2023-11-19 DIAGNOSIS — M4804 Spinal stenosis, thoracic region: Secondary | ICD-10-CM | POA: Diagnosis not present

## 2023-11-19 DIAGNOSIS — F1090 Alcohol use, unspecified, uncomplicated: Secondary | ICD-10-CM | POA: Diagnosis not present

## 2023-11-19 HISTORY — PX: FORAMINOTOMY 1 LEVEL: SHX5835

## 2023-11-19 LAB — SURGICAL PCR SCREEN
MRSA, PCR: NEGATIVE
Staphylococcus aureus: NEGATIVE

## 2023-11-19 LAB — ABO/RH: ABO/RH(D): A POS

## 2023-11-19 SURGERY — FORAMINOTOMY, SPINE, THORACIC, MINIMALLY INVASIVE, 1 LEVEL, POSTERIOR APPROACH
Anesthesia: General

## 2023-11-19 MED ORDER — ACETAMINOPHEN 325 MG PO TABS: 650.0000 mg | ORAL_TABLET | ORAL | Status: DC | PRN

## 2023-11-19 MED ORDER — METHOCARBAMOL 500 MG PO TABS
500.0000 mg | ORAL_TABLET | Freq: Four times a day (QID) | ORAL | Status: DC | PRN
  Administered 2023-11-20: 500 mg via ORAL
  Filled 2023-11-19: qty 1

## 2023-11-19 MED ORDER — ACETAMINOPHEN 650 MG RE SUPP: 650.0000 mg | RECTAL | Status: DC | PRN

## 2023-11-19 MED ORDER — EPHEDRINE 5 MG/ML INJ
INTRAVENOUS | Status: AC
  Filled 2023-11-19: qty 5

## 2023-11-19 MED ORDER — ONDANSETRON HCL 4 MG/2ML IJ SOLN
INTRAMUSCULAR | Status: AC
Start: 2023-11-19 — End: 2023-11-19
  Filled 2023-11-19: qty 2

## 2023-11-19 MED ORDER — DEXMEDETOMIDINE HCL IN NACL 80 MCG/20ML IV SOLN
INTRAVENOUS | Status: AC
  Filled 2023-11-19: qty 20

## 2023-11-19 MED ORDER — FENTANYL CITRATE (PF) 100 MCG/2ML IJ SOLN: 25.0000 ug | INTRAMUSCULAR | Status: DC | PRN

## 2023-11-19 MED ORDER — SODIUM CHLORIDE 0.9% FLUSH
3.0000 mL | Freq: Two times a day (BID) | INTRAVENOUS | Status: DC
  Administered 2023-11-19 (×2): 3 mL via INTRAVENOUS

## 2023-11-19 MED ORDER — LACTATED RINGERS IV SOLN: INTRAVENOUS | Status: DC

## 2023-11-19 MED ORDER — DEXAMETHASONE SOD PHOSPHATE PF 10 MG/ML IJ SOLN
INTRAMUSCULAR | Status: DC | PRN
  Administered 2023-11-19: 10 mg via INTRAVENOUS

## 2023-11-19 MED ORDER — SODIUM CHLORIDE 0.9% FLUSH: 3.0000 mL | INTRAVENOUS | Status: DC | PRN

## 2023-11-19 MED ORDER — ROCURONIUM BROMIDE 10 MG/ML (PF) SYRINGE
PREFILLED_SYRINGE | INTRAVENOUS | Status: AC
  Filled 2023-11-19: qty 10

## 2023-11-19 MED ORDER — CEFAZOLIN SODIUM-DEXTROSE 3-4 GM/150ML-% IV SOLN
3.0000 g | INTRAVENOUS | Status: AC
  Administered 2023-11-19: 3 g via INTRAVENOUS
  Filled 2023-11-19: qty 150

## 2023-11-19 MED ORDER — ONDANSETRON HCL 4 MG/2ML IJ SOLN: 4.0000 mg | Freq: Four times a day (QID) | INTRAMUSCULAR | Status: DC | PRN

## 2023-11-19 MED ORDER — ONDANSETRON HCL 4 MG/2ML IJ SOLN
INTRAMUSCULAR | Status: DC | PRN
  Administered 2023-11-19: 4 mg via INTRAVENOUS

## 2023-11-19 MED ORDER — MIDAZOLAM HCL 2 MG/2ML IJ SOLN
INTRAMUSCULAR | Status: AC
  Filled 2023-11-19: qty 2

## 2023-11-19 MED ORDER — MENTHOL 3 MG MT LOZG: 1.0000 | LOZENGE | OROMUCOSAL | Status: DC | PRN

## 2023-11-19 MED ORDER — ONDANSETRON HCL 4 MG PO TABS: 4.0000 mg | ORAL_TABLET | Freq: Four times a day (QID) | ORAL | Status: DC | PRN

## 2023-11-19 MED ORDER — PROPOFOL 10 MG/ML IV BOLUS
INTRAVENOUS | Status: DC | PRN
  Administered 2023-11-19: 200 mg via INTRAVENOUS

## 2023-11-19 MED ORDER — THROMBIN 5000 UNITS EX SOLR
OROMUCOSAL | Status: DC | PRN
  Administered 2023-11-19: 5 mL via TOPICAL

## 2023-11-19 MED ORDER — PHENYLEPHRINE HCL-NACL 20-0.9 MG/250ML-% IV SOLN
INTRAVENOUS | Status: DC | PRN
  Administered 2023-11-19: 25 ug/min via INTRAVENOUS

## 2023-11-19 MED ORDER — METHOCARBAMOL 1000 MG/10ML IJ SOLN: 500.0000 mg | Freq: Four times a day (QID) | INTRAMUSCULAR | Status: DC | PRN

## 2023-11-19 MED ORDER — SUGAMMADEX SODIUM 200 MG/2ML IV SOLN
INTRAVENOUS | Status: DC | PRN
  Administered 2023-11-19: 200 mg via INTRAVENOUS

## 2023-11-19 MED ORDER — FENTANYL CITRATE (PF) 250 MCG/5ML IJ SOLN
INTRAMUSCULAR | Status: DC | PRN
  Administered 2023-11-19 (×2): 50 ug via INTRAVENOUS
  Administered 2023-11-19: 100 ug via INTRAVENOUS
  Administered 2023-11-19: 50 ug via INTRAVENOUS

## 2023-11-19 MED ORDER — DOCUSATE SODIUM 100 MG PO CAPS
100.0000 mg | ORAL_CAPSULE | Freq: Two times a day (BID) | ORAL | Status: DC
  Administered 2023-11-19 – 2023-11-20 (×2): 100 mg via ORAL
  Filled 2023-11-19 (×2): qty 1

## 2023-11-19 MED ORDER — PHENOL 1.4 % MT LIQD: 1.0000 | OROMUCOSAL | Status: DC | PRN

## 2023-11-19 MED ORDER — POLYETHYLENE GLYCOL 3350 17 G PO PACK: 17.0000 g | PACK | Freq: Every day | ORAL | Status: DC | PRN

## 2023-11-19 MED ORDER — CHLORHEXIDINE GLUCONATE CLOTH 2 % EX PADS: 6.0000 | MEDICATED_PAD | Freq: Once | CUTANEOUS | Status: DC

## 2023-11-19 MED ORDER — ADHERUS DURAL SEALANT
PACK | TOPICAL | Status: DC | PRN
  Administered 2023-11-19: 1 via TOPICAL

## 2023-11-19 MED ORDER — FLUTICASONE FUROATE-VILANTEROL 100-25 MCG/ACT IN AEPB
1.0000 | INHALATION_SPRAY | Freq: Every day | RESPIRATORY_TRACT | Status: DC
Start: 2023-11-20 — End: 2023-11-20
  Filled 2023-11-19: qty 28

## 2023-11-19 MED ORDER — FENTANYL CITRATE (PF) 250 MCG/5ML IJ SOLN
INTRAMUSCULAR | Status: AC
  Filled 2023-11-19: qty 5

## 2023-11-19 MED ORDER — CHLORHEXIDINE GLUCONATE 0.12 % MT SOLN
15.0000 mL | Freq: Once | OROMUCOSAL | Status: AC
  Administered 2023-11-19: 15 mL via OROMUCOSAL
  Filled 2023-11-19: qty 15

## 2023-11-19 MED ORDER — BUPIVACAINE HCL (PF) 0.5 % IJ SOLN
INTRAMUSCULAR | Status: DC | PRN
  Administered 2023-11-19: 5 mL

## 2023-11-19 MED ORDER — CEFAZOLIN SODIUM-DEXTROSE 2-4 GM/100ML-% IV SOLN
2.0000 g | Freq: Three times a day (TID) | INTRAVENOUS | Status: AC
  Administered 2023-11-19 (×2): 2 g via INTRAVENOUS
  Filled 2023-11-19 (×2): qty 100

## 2023-11-19 MED ORDER — LIDOCAINE 2% (20 MG/ML) 5 ML SYRINGE
INTRAMUSCULAR | Status: DC | PRN
  Administered 2023-11-19: 80 mg via INTRAVENOUS

## 2023-11-19 MED ORDER — PHENYLEPHRINE 80 MCG/ML (10ML) SYRINGE FOR IV PUSH (FOR BLOOD PRESSURE SUPPORT)
PREFILLED_SYRINGE | INTRAVENOUS | Status: AC
Start: 2023-11-19 — End: 2023-11-19
  Filled 2023-11-19: qty 10

## 2023-11-19 MED ORDER — PROPOFOL 10 MG/ML IV BOLUS
INTRAVENOUS | Status: AC
Start: 2023-11-19 — End: 2023-11-19
  Filled 2023-11-19: qty 20

## 2023-11-19 MED ORDER — FLEET ENEMA RE ENEM: 1.0000 | ENEMA | Freq: Once | RECTAL | Status: DC | PRN

## 2023-11-19 MED ORDER — BISACODYL 10 MG RE SUPP: 10.0000 mg | Freq: Every day | RECTAL | Status: DC | PRN

## 2023-11-19 MED ORDER — 0.9 % SODIUM CHLORIDE (POUR BTL) OPTIME
TOPICAL | Status: DC | PRN
  Administered 2023-11-19: 1000 mL

## 2023-11-19 MED ORDER — MIDAZOLAM HCL (PF) 2 MG/2ML IJ SOLN
INTRAMUSCULAR | Status: DC | PRN
  Administered 2023-11-19 (×2): 1 mg via INTRAVENOUS

## 2023-11-19 MED ORDER — ROCURONIUM BROMIDE 10 MG/ML (PF) SYRINGE
PREFILLED_SYRINGE | INTRAVENOUS | Status: DC | PRN
  Administered 2023-11-19: 20 mg via INTRAVENOUS
  Administered 2023-11-19 (×2): 10 mg via INTRAVENOUS
  Administered 2023-11-19: 70 mg via INTRAVENOUS
  Administered 2023-11-19: 10 mg via INTRAVENOUS

## 2023-11-19 MED ORDER — EPHEDRINE SULFATE-NACL 50-0.9 MG/10ML-% IV SOSY
PREFILLED_SYRINGE | INTRAVENOUS | Status: DC | PRN
  Administered 2023-11-19 (×2): 5 mg via INTRAVENOUS

## 2023-11-19 MED ORDER — ALBUMIN HUMAN 5 % IV SOLN: INTRAVENOUS | Status: DC | PRN

## 2023-11-19 MED ORDER — ALBUTEROL SULFATE (2.5 MG/3ML) 0.083% IN NEBU
3.0000 mL | INHALATION_SOLUTION | Freq: Four times a day (QID) | RESPIRATORY_TRACT | Status: DC | PRN
Start: 2023-11-19 — End: 2023-11-20

## 2023-11-19 MED ORDER — DEXMEDETOMIDINE HCL IN NACL 80 MCG/20ML IV SOLN
INTRAVENOUS | Status: DC | PRN
  Administered 2023-11-19: 8 ug via INTRAVENOUS

## 2023-11-19 MED ORDER — LIDOCAINE-EPINEPHRINE 1 %-1:100000 IJ SOLN
INTRAMUSCULAR | Status: DC | PRN
  Administered 2023-11-19: 5 mL

## 2023-11-19 MED ORDER — LISINOPRIL 20 MG PO TABS
20.0000 mg | ORAL_TABLET | Freq: Every day | ORAL | Status: DC
  Administered 2023-11-19 – 2023-11-20 (×2): 20 mg via ORAL
  Filled 2023-11-19 (×2): qty 1

## 2023-11-19 MED ORDER — SENNA 8.6 MG PO TABS
1.0000 | ORAL_TABLET | Freq: Two times a day (BID) | ORAL | Status: DC
  Administered 2023-11-19 – 2023-11-20 (×2): 8.6 mg via ORAL
  Filled 2023-11-19 (×2): qty 1

## 2023-11-19 MED ORDER — SODIUM CHLORIDE 0.9 % IV SOLN
250.0000 mL | INTRAVENOUS | Status: AC
  Administered 2023-11-19: 250 mL via INTRAVENOUS

## 2023-11-19 MED ORDER — ORAL CARE MOUTH RINSE: 15.0000 mL | Freq: Once | OROMUCOSAL | Status: AC

## 2023-11-19 MED ORDER — BUPIVACAINE HCL (PF) 0.5 % IJ SOLN
INTRAMUSCULAR | Status: AC
  Filled 2023-11-19: qty 30

## 2023-11-19 MED ORDER — LIDOCAINE-EPINEPHRINE 1 %-1:100000 IJ SOLN
INTRAMUSCULAR | Status: AC
  Filled 2023-11-19: qty 1

## 2023-11-19 MED ORDER — ACETAMINOPHEN 10 MG/ML IV SOLN: 1000.0000 mg | Freq: Once | INTRAVENOUS | Status: DC | PRN

## 2023-11-19 MED ORDER — THROMBIN 5000 UNITS EX KIT
PACK | CUTANEOUS | Status: AC
  Filled 2023-11-19: qty 1

## 2023-11-19 MED ORDER — OXYCODONE-ACETAMINOPHEN 5-325 MG PO TABS
1.0000 | ORAL_TABLET | Freq: Four times a day (QID) | ORAL | Status: DC | PRN
  Administered 2023-11-19 – 2023-11-20 (×3): 2 via ORAL
  Filled 2023-11-19 (×3): qty 2

## 2023-11-19 MED ORDER — HYDROMORPHONE HCL 1 MG/ML IJ SOLN: 0.5000 mg | INTRAMUSCULAR | Status: DC | PRN

## 2023-11-19 MED ORDER — LIDOCAINE 2% (20 MG/ML) 5 ML SYRINGE
INTRAMUSCULAR | Status: AC
  Filled 2023-11-19: qty 5

## 2023-11-19 SURGICAL SUPPLY — 42 items
BAG COUNTER SPONGE SURGICOUNT (BAG) ×1 IMPLANT
BAND RUBBER #18 3X1/16 STRL (MISCELLANEOUS) ×2 IMPLANT
BLADE CLIPPER SURG (BLADE) IMPLANT
BLADE SURG 15 STRL LF DISP TIS (BLADE) ×1 IMPLANT
BUR SURG MATCH HEAD 2.5X12.5 (BUR) ×1 IMPLANT
BUR SURGICAL HEAD PROX 3X12.5 (BURR) IMPLANT
CANISTER SUCTION 3000ML PPV (SUCTIONS) ×1 IMPLANT
DERMABOND ADVANCED .7 DNX12 (GAUZE/BANDAGES/DRESSINGS) ×1 IMPLANT
DRAPE C-ARM 42X72 X-RAY (DRAPES) ×2 IMPLANT
DRAPE LAPAROTOMY 100X72X124 (DRAPES) ×1 IMPLANT
DRAPE MICROSCOPE SLANT 54X150 (MISCELLANEOUS) ×1 IMPLANT
DURAPREP 6ML APPLICATOR 50/CS (WOUND CARE) ×1 IMPLANT
ELECT BLADE 6.5 EXT (BLADE) ×1 IMPLANT
ELECTRODE REM PT RTRN 9FT ADLT (ELECTROSURGICAL) ×1 IMPLANT
GAUZE 4X4 16PLY ~~LOC~~+RFID DBL (SPONGE) IMPLANT
GLOVE BIOGEL PI IND STRL 8.5 (GLOVE) ×1 IMPLANT
GLOVE ECLIPSE 8.5 STRL (GLOVE) ×1 IMPLANT
GLOVE SURG SS PI 6.5 STRL IVOR (GLOVE) IMPLANT
GOWN STRL REUS W/ TWL LRG LVL3 (GOWN DISPOSABLE) IMPLANT
GOWN STRL REUS W/ TWL XL LVL3 (GOWN DISPOSABLE) IMPLANT
GOWN STRL REUS W/TWL 2XL LVL3 (GOWN DISPOSABLE) ×1 IMPLANT
HEMOSTAT POWDER KIT SURGIFOAM (HEMOSTASIS) ×1 IMPLANT
KIT BASIN OR (CUSTOM PROCEDURE TRAY) ×1 IMPLANT
KIT TURNOVER KIT B (KITS) ×1 IMPLANT
NDL HYPO 18GX1.5 BLUNT FILL (NEEDLE) IMPLANT
NDL HYPO 22X1.5 SAFETY MO (MISCELLANEOUS) ×1 IMPLANT
NDL SPNL 20GX3.5 QUINCKE YW (NEEDLE) IMPLANT
NEEDLE HYPO 18GX1.5 BLUNT FILL (NEEDLE) IMPLANT
NEEDLE HYPO 22X1.5 SAFETY MO (MISCELLANEOUS) ×1 IMPLANT
NEEDLE SPNL 20GX3.5 QUINCKE YW (NEEDLE) IMPLANT
PACK LAMINECTOMY NEURO (CUSTOM PROCEDURE TRAY) ×1 IMPLANT
PAD ARMBOARD POSITIONER FOAM (MISCELLANEOUS) ×3 IMPLANT
SEALANT ADHERUS EXTEND TIP (MISCELLANEOUS) IMPLANT
SOLN 0.9% NACL POUR BTL 1000ML (IV SOLUTION) ×1 IMPLANT
SOLN STERILE WATER BTL 1000 ML (IV SOLUTION) ×1 IMPLANT
SPIKE FLUID TRANSFER (MISCELLANEOUS) ×1 IMPLANT
SUT VIC AB 3-0 SH 8-18 (SUTURE) ×1 IMPLANT
SUT VIC AB 4-0 RB1 18 (SUTURE) ×1 IMPLANT
SYR 5ML LL (SYRINGE) IMPLANT
TOWEL GREEN STERILE (TOWEL DISPOSABLE) ×1 IMPLANT
TOWEL GREEN STERILE FF (TOWEL DISPOSABLE) ×1 IMPLANT
TUBING FEATHERFLOW (TUBING) ×1 IMPLANT

## 2023-11-19 NOTE — Transfer of Care (Signed)
 Immediate Anesthesia Transfer of Care Note  Patient: Andrew Miles  Procedure(s) Performed: THORACIC LAMINECTOMY THORACIC ELEVEN-THORACIC TWELVE, MINIMALLY INVASIVE WITH METRX  Patient Location: PACU  Anesthesia Type:General  Level of Consciousness: awake, alert , patient cooperative, and responds to stimulation  Airway & Oxygen Therapy: Patient Spontanous Breathing and Patient connected to face mask oxygen  Post-op Assessment: Report given to RN, Post -op Vital signs reviewed and stable, and Patient moving all extremities X 4  Post vital signs: Reviewed and stable  Last Vitals:  Vitals Value Taken Time  BP 150/59 11/19/23 11:00  Temp 36.9 C 11/19/23 11:00  Pulse 88 11/19/23 11:03  Resp 11 11/19/23 11:03  SpO2 94 % 11/19/23 11:03  Vitals shown include unfiled device data.  Last Pain:  Vitals:   11/19/23 0633  TempSrc:   PainSc: 7          Complications: No notable events documented.

## 2023-11-19 NOTE — Progress Notes (Signed)
 Pt has wounds on BLE- dry skin, scabbed; no swelling or drainage. Dr. Colon is aware.

## 2023-11-19 NOTE — Plan of Care (Signed)

## 2023-11-19 NOTE — Op Note (Signed)
 Date of surgery: 11/19/2023 Preoperative diagnosis: Thoracic spondylosis with cord compression T11-T12 Postoperative diagnosis: Same Procedure: Thoracic laminectomy with sublaminar decompression of T11-T12 using Metrix retractor operating microscope Surgeon: Victory Gens Anesthesia: General endotracheal Indications: Andrew Miles is a 68 year old individual whose had significant progressive weakness in his lower extremities and back pain.  He has evidence of a high-grade stenosis at the level of T11-T12 with cord signal change at that level most of the compression appears from the dorsal aspect of the canal from hypertrophied laminar and interspinous ligament.  Laminectomy is now being planned to decompress the common dural tube at the level of T11-T12.  Metrix retractor is being used.  Procedure: Patient was brought to the operating room supine on the stretcher.  After the smooth induction of general endotracheal anesthesia he was carefully turned prone.  The back was prepped with alcohol and then fluoroscopic guidance was used to localize the T11-T12 interspace.  A trajectory from a right oblique approach was chosen and the skin was marked for the preincision.  Then after prepping with DuraPrep and draped and sterilely 10 cc of lidocaine with epinephrine mixed 50-50 with half percent Marcaine was injected into the subcutaneous tissues.  A small vertical incision was created and carried down to the thoracodorsal fascia.  A K wire was then passed to the laminar arch of the T11.  Using a 1 then technique then a series of dilators was passed over the K wire to dilate the space to the 22 mm size 22 mm x 7 cm deep Metrix cannula was then secured to the operating table with a clamp.  The operating microscope was brought into view and the laminar arch of T11 on the right side was observed.  The dissection was carried down to the inferior border of the laminar arch.  Then using a monopolar cautery to clear the  soft tissues in this region we then used a 2 mm bit on a high-speed drill to remove the lamina and create a laminotomy and the T11 laminar arch.  This was gradually enlarged.  There is noted to be significant thickening of the interspinous ligament in this region.  Ultimately the dura was identified and the laminotomy was created and carried superiorly to expose a small area of epidural fat then by tracing back through this area the tight stenosis was encountered.  The dura was noted to be markedly adherent to the stenosis and careful dissection was performed under the operating microscope nonetheless a small dural tear did occur in the lateral aspect of the right side of the laminotomy.  This was tamponaded and further dissection was carried in directions away from this area.  The dissection was carried inferiorly until ultimately the dura was relieved of significant compression from the dorsal aspect.  The dura was carefully protected and when the inferior aspect of the stenosis was encountered and passed the epidural fat again made its presence.  We dissected this out laterally towards the right side first.  Then by tilting the tube and the scope across the midline was able to perform a sublaminar decompression to the left side.  This was done with a high-speed drill thinning the bone as we went.  Care was taken to protect the dura and ultimately a 1 mm Kerrison punch could be used on the opposite side to release a thin dural plate that was creating and sustaining the compression on the dorsal aspect of the dura.  Once this was removed good decompression was  noted.  The area was inspected for hemostasis and when all was verified adherence was placed over the dural opening.  Then the retractor was carefully removed checking for hemostasis as it was being removed.  10 cc of Marcaine was injected into the paraspinous fascia and the fascia was closed with a singular 3 oh stitch and then 3-0 Vicryl was used in the  deep fascial tissue and 4-0 Vicryl was used in the subcuticular skin Dermabond was placed on the skin blood loss was estimated at 100 cc.  Patient was returned to recovery room in stable condition

## 2023-11-19 NOTE — Anesthesia Procedure Notes (Addendum)
 Procedure Name: Intubation Date/Time: 11/19/2023 7:56 AM  Performed by: Jolynn Mage, CRNAPre-anesthesia Checklist: Patient identified, Patient being monitored, Timeout performed, Emergency Drugs available and Suction available Patient Re-evaluated:Patient Re-evaluated prior to induction Oxygen Delivery Method: Circle system utilized Preoxygenation: Pre-oxygenation with 100% oxygen Induction Type: IV induction Ventilation: Mask ventilation without difficulty and Oral airway inserted - appropriate to patient size Laryngoscope Size: Mac, Glidescope and 4 Grade View: Grade I Tube type: Oral Tube size: 7.5 mm Number of attempts: 1 Airway Equipment and Method: Video-laryngoscopy and Rigid stylet Placement Confirmation: ETT inserted through vocal cords under direct vision, positive ETCO2 and breath sounds checked- equal and bilateral Secured at: 22 cm Tube secured with: Tape Dental Injury: Teeth and Oropharynx as per pre-operative assessment  Difficulty Due To: Difficult Airway- due to reduced neck mobility and Difficult Airway- due to limited oral opening

## 2023-11-19 NOTE — Anesthesia Postprocedure Evaluation (Signed)
 Anesthesia Post Note  Patient: CRISTINA MATTERN  Procedure(s) Performed: THORACIC LAMINECTOMY THORACIC ELEVEN-THORACIC TWELVE, MINIMALLY INVASIVE WITH METRX     Patient location during evaluation: PACU Anesthesia Type: General Level of consciousness: awake and alert Pain management: pain level controlled Vital Signs Assessment: post-procedure vital signs reviewed and stable Respiratory status: spontaneous breathing, nonlabored ventilation, respiratory function stable and patient connected to nasal cannula oxygen Cardiovascular status: blood pressure returned to baseline and stable Postop Assessment: no apparent nausea or vomiting Anesthetic complications: no   No notable events documented.  Last Vitals:  Vitals:   11/19/23 1130 11/19/23 1145  BP: 123/62 128/64  Pulse: 81 82  Resp: 12 12  Temp:    SpO2: 93% 92%    Last Pain:  Vitals:   11/19/23 1130  TempSrc:   PainSc: Asleep                 Rome Ade

## 2023-11-19 NOTE — H&P (Addendum)
 Andrew Miles is an 68 y.o. male.   Chief Complaint: Back pain bilateral leg weakness HPI: Patient is a 69 year old individual has had significant back pain he has evidence of spondylitic disease and evidence of some epidural lipomatosis however at the 11 T12 level he has some cord compression characterized by some lamina and facet overgrowth pressing on the dorsal aspect of his spinal cord.  He has been advised regarding surgical decompression at this level via sublaminar decompression.  Past Medical History:  Diagnosis Date   Cancer [facility])    prostate cancer 07/08   Hypertension     Past Surgical History:  Procedure Laterality Date   BACK SURGERY     KNEE SURGERY     SHOULDER SURGERY      History reviewed. No pertinent family history. Social History:  reports that he has never smoked. He has never used smokeless tobacco. He reports current alcohol use. He reports that he does not use drugs.  Allergies: No Known Allergies  Medications Prior to Admission  Medication Sig Dispense Refill   Aspirin-Caffeine (BAYER BACK & BODY) 500-32.5 MG TABS Take 2 tablets by mouth 3 (three) times a week.     budesonide-formoterol (SYMBICORT) 80-4.5 MCG/ACT inhaler Inhale 2 puffs into the lungs daily as needed (Asthma).     GINSENG EXTRACT PO Take 3 oz by mouth 2 (two) times a week.     lisinopril (ZESTRIL) 20 MG tablet Take 20 mg by mouth daily.     Misc Natural Products (PROSTATE SUPPORT PO) Take 2 capsules by mouth every evening.     albuterol (VENTOLIN HFA) 108 (90 Base) MCG/ACT inhaler Inhale 1-2 puffs into the lungs every 6 (six) hours as needed for wheezing or shortness of breath.     tadalafil (CIALIS) 20 MG tablet Take 20 mg by mouth daily as needed.      Results for orders placed or performed during the hospital encounter of 11/19/23 (from the past 48 hours)  ABO/Rh     Status: None (Preliminary result)   Collection Time: 11/19/23  7:15 AM  Result Value Ref Range   ABO/RH(D) PENDING     No results found.  Review of Systems  Constitutional:  Positive for activity change.  Musculoskeletal:  Positive for back pain and gait problem.  Neurological:  Positive for weakness and numbness.  All other systems reviewed and are negative.   Blood pressure (!) 142/97, pulse 94, temperature 97.9 F (36.6 C), temperature source Oral, resp. rate 20, height 6' (1.829 m), weight 126.1 kg, SpO2 95%. Physical Exam Constitutional:      Appearance: Normal appearance. He is obese.  HENT:     Head: Normocephalic and atraumatic.     Right Ear: Tympanic membrane, ear canal and external ear normal.     Left Ear: Tympanic membrane, ear canal and external ear normal.     Nose: Nose normal.     Mouth/Throat:     Mouth: Mucous membranes are moist.     Pharynx: Oropharynx is clear.  Eyes:     Extraocular Movements: Extraocular movements intact.     Conjunctiva/sclera: Conjunctivae normal.     Pupils: Pupils are equal, round, and reactive to light.  Cardiovascular:     Rate and Rhythm: Normal rate and regular rhythm.     Pulses: Normal pulses.     Heart sounds: Normal heart sounds.  Abdominal:     General: Abdomen is flat. Bowel sounds are normal.     Palpations: Abdomen  is soft.  Musculoskeletal:        General: Normal range of motion.  Skin:    General: Skin is warm and dry.     Capillary Refill: Capillary refill takes less than 2 seconds.  Neurological:     Mental Status: He is alert.     Comments: Hyperreflexia in both lower extremities and 3+ in the patella and the Achilles.  Positive Babinski bilaterally.  Sensation is diminished in the dorsum of the thighs and the distal lower extremities.  Strength to confrontational testing is good.  Psychiatric:        Mood and Affect: Mood normal.        Behavior: Behavior normal.        Thought Content: Thought content normal.        Judgment: Judgment normal.      Assessment/Plan Spinal stenosis with myelopathy T11-T12  Plan:  Sublaminar decompression T11-T12.  Victory JINNY Gens, MD 11/19/2023, 7:33 AM

## 2023-11-19 NOTE — Anesthesia Preprocedure Evaluation (Signed)
 Anesthesia Evaluation  Patient identified by MRN, date of birth, ID band Patient awake    Reviewed: Allergy & Precautions, NPO status , Patient's Chart, lab work & pertinent test results  Airway Mallampati: II  TM Distance: >3 FB Neck ROM: Full    Dental no notable dental hx.    Pulmonary neg pulmonary ROS   Pulmonary exam normal        Cardiovascular hypertension,  Rhythm:Regular Rate:Normal     Neuro/Psych negative neurological ROS  negative psych ROS   GI/Hepatic negative GI ROS, Neg liver ROS,,,  Endo/Other  negative endocrine ROS    Renal/GU negative Renal ROS  negative genitourinary   Musculoskeletal Thoracic stenosis    Abdominal Normal abdominal exam  (+)   Peds  Hematology Lab Results      Component                Value               Date                      WBC                      14.3 (H)            02/13/2021                HGB                      13.5                02/13/2021                HCT                      41.9                02/13/2021                MCV                      83.3                02/13/2021                PLT                      276                 02/13/2021              Anesthesia Other Findings   Reproductive/Obstetrics                              Anesthesia Physical Anesthesia Plan  ASA: 2  Anesthesia Plan: General   Post-op Pain Management:    Induction: Intravenous  PONV Risk Score and Plan: 2 and Ondansetron , Dexamethasone and Treatment may vary due to age or medical condition  Airway Management Planned: Mask and Oral ETT  Additional Equipment: None  Intra-op Plan:   Post-operative Plan: Extubation in OR  Informed Consent: I have reviewed the patients History and Physical, chart, labs and discussed the procedure including the risks, benefits and alternatives for the proposed anesthesia with the patient or authorized  representative who has indicated his/her understanding and acceptance.  Dental advisory given  Plan Discussed with: CRNA  Anesthesia Plan Comments:         Anesthesia Quick Evaluation

## 2023-11-20 ENCOUNTER — Encounter (HOSPITAL_COMMUNITY): Payer: Self-pay | Admitting: Neurological Surgery

## 2023-11-20 DIAGNOSIS — M4714 Other spondylosis with myelopathy, thoracic region: Secondary | ICD-10-CM | POA: Diagnosis not present

## 2023-11-20 MED ORDER — METHOCARBAMOL 500 MG PO TABS: 500.0000 mg | ORAL_TABLET | Freq: Four times a day (QID) | ORAL | 2 refills | Status: AC | PRN

## 2023-11-20 MED ORDER — OXYCODONE-ACETAMINOPHEN 5-325 MG PO TABS: 1.0000 | ORAL_TABLET | Freq: Four times a day (QID) | ORAL | 0 refills | Status: AC | PRN

## 2023-11-20 NOTE — Evaluation (Signed)
 Occupational Therapy Evaluation Patient Details Name: Andrew Miles MRN: [id number] DOB: [birth year] Today's Date: 11/20/2023   History of Present Illness   Pt is a 68 y.o male who is s/p laminectomy T11-T12. PMH: prostate cancer, HTN     Clinical Impressions Andrew Miles was evaluated s/p the above spine surgery. He lives with his wife and is indep at baseline. Upon evaluation pt was limited by surgical pain, generalized weakness and spinal precautions. Overall he needed min A for LB ADLs but was superivsion A - mod I for other ADLs and mobility with a RW. Provided cues and education on spinal precautions and compensatory techniques throughout, handout provided and pt demonstrated good recall during ADLs and mobility. Pt does not require further acute OT services. Recommend d/c home with support of family.       If plan is discharge home, recommend the following:   A little help with bathing/dressing/bathroom;Assistance with cooking/housework;Assist for transportation     Functional Status Assessment   Patient has had a recent decline in their functional status and demonstrates the ability to make significant improvements in function in a reasonable and predictable amount of time.     Equipment Recommendations   None recommended by OT      Precautions/Restrictions   Precautions Precautions: Fall;Back Precaution Booklet Issued: Yes (comment) Recall of Precautions/Restrictions: Intact Restrictions Weight Bearing Restrictions Per Provider Order: No     Mobility Bed Mobility Overal bed mobility: Needs Assistance             General bed mobility comments: OOB on arrival, pt reported that he has been sleeping in a recliner and plans to at discharge. Verbally reviewed log roll.    Transfers Overall transfer level: Needs assistance Equipment used: Rolling walker (2 wheels) Transfers: Sit to/from Stand Sit to Stand: Supervision                  Balance  Overall balance assessment: Needs assistance Sitting-balance support: Feet supported Sitting balance-Leahy Scale: Good     Standing balance support: Single extremity supported, During functional activity Standing balance-Leahy Scale: Fair Standing balance comment: statically                           ADL either performed or assessed with clinical judgement   ADL Overall ADL's : Needs assistance/impaired Eating/Feeding: Independent   Grooming: Modified independent;Standing   Upper Body Bathing: Modified independent   Lower Body Bathing: Supervison/ safety;Sit to/from stand   Upper Body Dressing : Modified independent   Lower Body Dressing: Minimal assistance   Toilet Transfer: Supervision/safety   Toileting- Clothing Manipulation and Hygiene: Modified independent       Functional mobility during ADLs: Supervision/safety General ADL Comments: min A for LB dressing, RW used for all standing tasks     Vision Baseline Vision/History: 0 No visual deficits Vision Assessment?: No apparent visual deficits     Perception Perception: Within Functional Limits       Praxis Praxis: WFL       Pertinent Vitals/Pain Pain Assessment Pain Assessment: Faces Faces Pain Scale: Hurts little more Pain Location: back Pain Descriptors / Indicators: Guarding Pain Intervention(s): Limited activity within patient's tolerance, Monitored during session     Extremity/Trunk Assessment Upper Extremity Assessment Upper Extremity Assessment: Overall WFL for tasks assessed   Lower Extremity Assessment Lower Extremity Assessment: Defer to PT evaluation   Cervical / Trunk Assessment Cervical / Trunk Assessment: Back Surgery   Communication Communication  Communication: No apparent difficulties   Cognition Arousal: Alert Behavior During Therapy: WFL for tasks assessed/performed Cognition: No apparent impairments                               Following commands:  Intact       Cueing  General Comments   Cueing Techniques: Verbal cues  VSS on RA           Home Living Family/patient expects to be discharged to:: Private residence Living Arrangements: Spouse/significant other;Children (dtr staying as long as needed, wife works) Available Help at Discharge: Family;Available 24 hours/day Type of Home: House Home Access: Stairs to enter   Entrance Stairs-Rails: None Home Layout: One level     Bathroom Shower/Tub: Producer, television/film/video: Standard     Home Equipment: Agricultural consultant (2 wheels);Cane - single point;Shower seat          Prior Functioning/Environment Prior Level of Function : Independent/Modified Independent;Driving             Mobility Comments: was not using AD ADLs Comments: mod I, drives. was very limited by pain    OT Problem List: Decreased strength;Decreased range of motion;Decreased activity tolerance;Decreased safety awareness;Decreased knowledge of use of DME or AE;Decreased knowledge of precautions   OT Treatment/Interventions:        OT Goals(Current goals can be found in the care plan section)   Acute Rehab OT Goals Patient Stated Goal: home OT Goal Formulation: With patient Time For Goal Achievement: 12/04/23 Potential to Achieve Goals: Good   AM-PAC OT 6 Clicks Daily Activity     Outcome Measure Help from another person eating meals?: None Help from another person taking care of personal grooming?: None Help from another person toileting, which includes using toliet, bedpan, or urinal?: A Little Help from another person bathing (including washing, rinsing, drying)?: A Little Help from another person to put on and taking off regular upper body clothing?: None Help from another person to put on and taking off regular lower body clothing?: A Little 6 Click Score: 21   End of Session Equipment Utilized During Treatment: Rolling walker (2 wheels) Nurse Communication: Mobility  status  Activity Tolerance: Patient tolerated treatment well Patient left: in chair;with call bell/phone within reach  OT Visit Diagnosis: Other abnormalities of gait and mobility (R26.89);Muscle weakness (generalized) (M62.81)                Time: 9195-9176 OT Time Calculation (min): 19 min Charges:  OT General Charges $OT Visit: 1 Visit OT Evaluation $OT Eval Moderate Complexity: 1 Mod  Lucie Kendall, OTR/L Acute Rehabilitation Services Office [phone] Secure Chat Communication Preferred   Lucie JONETTA Kendall 11/20/2023, 9:08 AM

## 2023-11-20 NOTE — Discharge Instructions (Signed)
 Wound Care Leave incision open to air. You may shower. Do not scrub directly on incision.  Do not put any creams, lotions, or ointments on incision. Activity Walk each and every day, increasing distance each day. No lifting greater than 8 lbs.  Avoid bending, arching, and twisting. No driving for 2 weeks; may ride as a passenger locally.  Diet Resume your normal diet.   Call Your Doctor If Any of These Occur Redness, drainage, or swelling at the wound.  Temperature greater than 101 degrees. Severe pain not relieved by pain medication. Incision starts to come apart. Follow Up Appt Call today for appointment in 4 weeks ([phone]) or for problems.  If you have any hardware placed in your spine, you will need an x-ray before your appointment.

## 2023-11-20 NOTE — Progress Notes (Signed)

## 2023-11-20 NOTE — Progress Notes (Signed)
   11/20/23 1053  TOC Brief Assessment  Insurance and Status Reviewed  Patient has primary care physician Yes  Home environment has been reviewed home w/ spouse  Prior level of function: self  Prior/Current Home Services No current home services  Social Drivers of Health Review SDOH reviewed no interventions necessary  Readmission risk has been reviewed Yes  Transition of care needs no transition of care needs at this time    Pt stable for transition home today, no HH or DME needs noted. Family to transport home

## 2023-11-20 NOTE — Discharge Summary (Signed)
 Physician Discharge Summary  Patient ID: Andrew Miles MRN: [id number] DOB/AGE: 69-Jul-1957 69 y.o.  Admit date: 11/19/2023 Discharge date: 11/20/2023  Admission Diagnoses: Thoracic stenosis T11-T12 with myelopathy  Discharge Diagnoses: Thoracic stenosis T11-T12.  Thoracic myelopathy. Principal Problem:   Myelopathy concurrent with and due to spinal stenosis of thoracic region [facility])   Discharged Condition: good  Hospital Course: Patient tolerated surgery well  Consults: None  Significant Diagnostic Studies: None  Treatments: surgery: See op note  Discharge Exam: Blood pressure (!) 150/63, pulse 84, temperature 99.1 F (37.3 C), temperature source Oral, resp. rate 16, height 6' (1.829 m), weight 126.1 kg, SpO2 98%. Incision is clean and dry Station and gait are intact.  Disposition: Discharge disposition: 01-Home or Self Care       Discharge Instructions     Call MD for:  redness, tenderness, or signs of infection (pain, swelling, redness, odor or green/yellow discharge around incision site)   Complete by: As directed    Call MD for:  severe uncontrolled pain   Complete by: As directed    Call MD for:  temperature >100.4   Complete by: As directed    Diet - low sodium heart healthy   Complete by: As directed    Discharge wound care:   Complete by: As directed    Okay to shower. Do not apply salves or appointments to incision. No heavy lifting with the upper extremities greater than 10 pounds. May resume driving when not requiring pain medication and patient feels comfortable with doing so.   Incentive spirometry RT   Complete by: As directed    Increase activity slowly   Complete by: As directed       Allergies as of 11/20/2023   No Known Allergies      Medication List     TAKE these medications    albuterol 108 (90 Base) MCG/ACT inhaler Commonly known as: VENTOLIN HFA Inhale 1-2 puffs into the lungs every 6 (six) hours as needed for wheezing or  shortness of breath.   Bayer Back & Body 500-32.5 MG Tabs Generic drug: Aspirin-Caffeine Take 2 tablets by mouth 3 (three) times a week.   budesonide-formoterol 80-4.5 MCG/ACT inhaler Commonly known as: SYMBICORT Inhale 2 puffs into the lungs daily as needed (Asthma).   GINSENG EXTRACT PO Take 3 oz by mouth 2 (two) times a week.   lisinopril 20 MG tablet Commonly known as: ZESTRIL Take 20 mg by mouth daily.   methocarbamol 500 MG tablet Commonly known as: ROBAXIN Take 1 tablet (500 mg total) by mouth every 6 (six) hours as needed for muscle spasms.   oxyCODONE-acetaminophen  5-325 MG tablet Commonly known as: PERCOCET/ROXICET Take 1-2 tablets by mouth every 6 (six) hours as needed for moderate pain (pain score 4-6) or severe pain (pain score 7-10).   PROSTATE SUPPORT PO Take 2 capsules by mouth every evening.   tadalafil 20 MG tablet Commonly known as: CIALIS Take 20 mg by mouth daily as needed.               Discharge Care Instructions  (From admission, onward)           Start     Ordered   11/20/23 0000  Discharge wound care:       Comments: Okay to shower. Do not apply salves or appointments to incision. No heavy lifting with the upper extremities greater than 10 pounds. May resume driving when not requiring pain medication and patient feels comfortable with doing  so.   11/20/23 0810             Signed: Victory PARAS Khiem Gargis 11/20/2023, 8:10 AM

## 2023-11-20 NOTE — Evaluation (Signed)
 Physical Therapy Evaluation  Patient Details Name: Andrew Miles MRN: [id number] DOB: 1955/09/09 Today's Date: 11/20/2023  History of Present Illness  Pt is a 68 y.o male who is s/p laminectomy T11-T12 on 11/19/2023. PMH: prostate cancer, HTN  Clinical Impression  Pt admitted with above diagnosis. At the time of PT eval, pt was able to demonstrate transfers and ambulation with gross supervision for safety and RW for support. Pt was educated on precautions, positioning recommendations, appropriate activity progression, and car transfer. Pt currently with functional limitations due to the deficits listed below (see PT Problem List). Pt will benefit from skilled PT to increase their independence and safety with mobility to allow discharge to the venue listed below.          If plan is discharge home, recommend the following: A little help with bathing/dressing/bathroom;Assistance with cooking/housework;Help with stairs or ramp for entrance;Assist for transportation   Can travel by private vehicle        Equipment Recommendations None recommended by PT  Recommendations for Other Services       Functional Status Assessment Patient has had a recent decline in their functional status and demonstrates the ability to make significant improvements in function in a reasonable and predictable amount of time.     Precautions / Restrictions Precautions Precautions: Fall;Back Precaution Booklet Issued: Yes (comment) Recall of Precautions/Restrictions: Intact Precaution/Restrictions Comments: Reviewed handout and pt was cued for precautions during functional mobility. Restrictions Weight Bearing Restrictions Per Provider Order: No      Mobility  Bed Mobility Overal bed mobility: Needs Assistance             General bed mobility comments: Verbally reviewed log roll in case he wants to get in the bed at home. Pt reports he has been sleeping primarily in the recliner at home for several  years.    Transfers Overall transfer level: Needs assistance Equipment used: Rolling walker (2 wheels) Transfers: Sit to/from Stand Sit to Stand: Supervision           General transfer comment: VC's for wide BOS. Pt was able to power up to full stand from low recliner chair. Pt demonstrated good controlled descent down to chair at end of session.    Ambulation/Gait Ambulation/Gait assistance: Supervision Gait Distance (Feet): 200 Feet Assistive device: Rolling walker (2 wheels), Straight cane Gait Pattern/deviations: Step-through pattern, Decreased stride length, Trunk flexed, Narrow base of support Gait velocity: Decreased Gait velocity interpretation: 1.31 - 2.62 ft/sec, indicative of limited community ambulator   General Gait Details: Slow and guarded due to pain. Trunk grossly flexed but pt making a good effort to improve posture. Initially with SPC (using in R hand) but pt getting cramping in R side of lower back. Switched to RW and pt reports slight improvement.  Stairs            Wheelchair Mobility     Tilt Bed    Modified Rankin (Stroke Patients Only)       Balance Overall balance assessment: Needs assistance Sitting-balance support: Feet supported Sitting balance-Leahy Scale: Good     Standing balance support: Single extremity supported, During functional activity Standing balance-Leahy Scale: Fair Standing balance comment: statically                             Pertinent Vitals/Pain Pain Assessment Pain Assessment: Faces Faces Pain Scale: Hurts little more Pain Location: back Pain Descriptors / Indicators: Guarding Pain Intervention(s): Limited  activity within patient's tolerance, Monitored during session, Repositioned    Home Living Family/patient expects to be discharged to:: Private residence Living Arrangements: Spouse/significant other;Children (dtr staying as long as needed, wife works) Available Help at Discharge:  Family;Available 24 hours/day Type of Home: House Home Access: Stairs to enter Entrance Stairs-Rails: None     Home Layout: One level Home Equipment: Agricultural consultant (2 wheels);Cane - single point;Shower seat;BSC/3in1      Prior Function Prior Level of Function : Independent/Modified Independent;Driving             Mobility Comments: Intermittently using SPC for pain control ADLs Comments: mod I, drives. was very limited by pain     Extremity/Trunk Assessment   Upper Extremity Assessment Upper Extremity Assessment: Overall WFL for tasks assessed    Lower Extremity Assessment Lower Extremity Assessment: Generalized weakness    Cervical / Trunk Assessment Cervical / Trunk Assessment: Back Surgery  Communication   Communication Communication: No apparent difficulties    Cognition Arousal: Alert Behavior During Therapy: WFL for tasks assessed/performed                             Following commands: Intact       Cueing Cueing Techniques: Verbal cues     General Comments General comments (skin integrity, edema, etc.): VSS on RA    Exercises     Assessment/Plan    PT Assessment Patient needs continued PT services  PT Problem List Decreased strength;Decreased activity tolerance;Decreased balance;Decreased mobility;Decreased knowledge of use of DME;Decreased safety awareness;Decreased knowledge of precautions;Pain       PT Treatment Interventions DME instruction;Gait training;Functional mobility training;Stair training;Therapeutic activities;Therapeutic exercise;Balance training;Patient/family education    PT Goals (Current goals can be found in the Care Plan section)  Acute Rehab PT Goals Patient Stated Goal: Home today PT Goal Formulation: With patient Time For Goal Achievement: 11/27/23 Potential to Achieve Goals: Good    Frequency Min 5X/week     Co-evaluation               AM-PAC PT 6 Clicks Mobility  Outcome Measure Help  needed turning from your back to your side while in a flat bed without using bedrails?: A Little Help needed moving from lying on your back to sitting on the side of a flat bed without using bedrails?: A Little Help needed moving to and from a bed to a chair (including a wheelchair)?: A Little Help needed standing up from a chair using your arms (e.g., wheelchair or bedside chair)?: A Little Help needed to walk in hospital room?: A Little Help needed climbing 3-5 steps with a railing? : A Little 6 Click Score: 18    End of Session Equipment Utilized During Treatment: Gait belt Activity Tolerance: Patient tolerated treatment well Patient left: in chair;with call bell/phone within reach;with family/visitor present Nurse Communication: Mobility status PT Visit Diagnosis: Unsteadiness on feet (R26.81);Pain Pain - part of body:  (back)    Time: 9099-9076 PT Time Calculation (min) (ACUTE ONLY): 23 min   Charges:   PT Evaluation $PT Eval Low Complexity: 1 Low PT Treatments $Gait Training: 8-22 mins PT General Charges $$ ACUTE PT VISIT: 1 Visit         Leita Sable, PT, DPT Acute Rehabilitation Services Secure Chat Preferred Office: [phone]   Leita JONETTA Sable 11/20/2023, 9:41 AM

## 2023-11-20 NOTE — Care Management Obs Status (Signed)
 MEDICARE OBSERVATION STATUS NOTIFICATION   Patient Details  Name: Andrew Miles MRN: [id number] Date of Birth: 10-Feb-1955   Medicare Observation Status Notification Given:  Yes    Jon Cruel 11/20/2023, 9:58 AM

## 2023-11-20 NOTE — Plan of Care (Signed)
